# Patient Record
Sex: Female | Born: 1960 | Race: White | Hispanic: No | Smoking: Never smoker
Health system: Southern US, Community
[De-identification: ages and names within clinical notes are randomized; demographics above are authoritative.]

## PROBLEM LIST (undated history)

## (undated) DIAGNOSIS — F419 Anxiety disorder, unspecified: Secondary | ICD-10-CM

## (undated) HISTORY — PX: GANGLION CYST EXCISION: SHX1691

## (undated) HISTORY — PX: BREAST BIOPSY: SHX20

## (undated) HISTORY — DX: Anxiety disorder, unspecified: F41.9

---

## 1985-02-15 HISTORY — PX: BREAST EXCISIONAL BIOPSY: SUR124

## 1997-11-04 ENCOUNTER — Other Ambulatory Visit: Admission: RE | Admit: 1997-11-04 | Discharge: 1997-11-04 | Payer: Self-pay | Admitting: Obstetrics and Gynecology

## 1998-11-14 ENCOUNTER — Other Ambulatory Visit: Admission: RE | Admit: 1998-11-14 | Discharge: 1998-11-14 | Payer: Self-pay | Admitting: Gynecology

## 1999-10-16 ENCOUNTER — Other Ambulatory Visit: Admission: RE | Admit: 1999-10-16 | Discharge: 1999-10-16 | Payer: Self-pay | Admitting: Gynecology

## 2000-04-08 ENCOUNTER — Encounter: Payer: Self-pay | Admitting: Gynecology

## 2000-04-08 ENCOUNTER — Encounter: Admission: RE | Admit: 2000-04-08 | Discharge: 2000-04-08 | Payer: Self-pay | Admitting: Gynecology

## 2000-10-07 ENCOUNTER — Encounter: Admission: RE | Admit: 2000-10-07 | Discharge: 2000-10-07 | Payer: Self-pay | Admitting: Gynecology

## 2000-10-07 ENCOUNTER — Encounter: Payer: Self-pay | Admitting: Gynecology

## 2000-11-21 ENCOUNTER — Other Ambulatory Visit: Admission: RE | Admit: 2000-11-21 | Discharge: 2000-11-21 | Payer: Self-pay | Admitting: Gynecology

## 2001-10-12 ENCOUNTER — Encounter: Admission: RE | Admit: 2001-10-12 | Discharge: 2001-10-12 | Payer: Self-pay | Admitting: Gynecology

## 2001-10-12 ENCOUNTER — Encounter: Payer: Self-pay | Admitting: Gynecology

## 2001-12-08 ENCOUNTER — Other Ambulatory Visit: Admission: RE | Admit: 2001-12-08 | Discharge: 2001-12-08 | Payer: Self-pay | Admitting: Gynecology

## 2002-02-15 HISTORY — PX: BREAST EXCISIONAL BIOPSY: SUR124

## 2002-10-17 ENCOUNTER — Encounter: Payer: Self-pay | Admitting: Gynecology

## 2002-10-17 ENCOUNTER — Encounter: Admission: RE | Admit: 2002-10-17 | Discharge: 2002-10-17 | Payer: Self-pay | Admitting: Gynecology

## 2003-01-04 ENCOUNTER — Other Ambulatory Visit: Admission: RE | Admit: 2003-01-04 | Discharge: 2003-01-04 | Payer: Self-pay | Admitting: Gynecology

## 2003-02-13 ENCOUNTER — Ambulatory Visit (HOSPITAL_COMMUNITY): Admission: RE | Admit: 2003-02-13 | Discharge: 2003-02-13 | Payer: Self-pay | Admitting: General Surgery

## 2003-11-21 ENCOUNTER — Encounter: Admission: RE | Admit: 2003-11-21 | Discharge: 2003-11-21 | Payer: Self-pay | Admitting: Gynecology

## 2003-11-27 ENCOUNTER — Encounter: Admission: RE | Admit: 2003-11-27 | Discharge: 2003-11-27 | Payer: Self-pay | Admitting: Gynecology

## 2004-01-07 ENCOUNTER — Other Ambulatory Visit: Admission: RE | Admit: 2004-01-07 | Discharge: 2004-01-07 | Payer: Self-pay | Admitting: Gynecology

## 2004-12-03 ENCOUNTER — Encounter: Admission: RE | Admit: 2004-12-03 | Discharge: 2004-12-03 | Payer: Self-pay | Admitting: Gynecology

## 2005-01-12 ENCOUNTER — Other Ambulatory Visit: Admission: RE | Admit: 2005-01-12 | Discharge: 2005-01-12 | Payer: Self-pay | Admitting: Gynecology

## 2005-01-21 ENCOUNTER — Ambulatory Visit: Payer: Self-pay | Admitting: Family Medicine

## 2005-03-11 ENCOUNTER — Ambulatory Visit: Payer: Self-pay | Admitting: Family Medicine

## 2005-04-02 ENCOUNTER — Ambulatory Visit: Payer: Self-pay | Admitting: Internal Medicine

## 2005-04-08 ENCOUNTER — Ambulatory Visit (HOSPITAL_COMMUNITY): Admission: RE | Admit: 2005-04-08 | Discharge: 2005-04-08 | Payer: Self-pay | Admitting: Internal Medicine

## 2005-06-07 ENCOUNTER — Ambulatory Visit: Payer: Self-pay | Admitting: Internal Medicine

## 2005-07-06 ENCOUNTER — Ambulatory Visit (HOSPITAL_COMMUNITY): Admission: RE | Admit: 2005-07-06 | Discharge: 2005-07-06 | Payer: Self-pay | Admitting: Internal Medicine

## 2005-07-06 ENCOUNTER — Ambulatory Visit: Payer: Self-pay | Admitting: Internal Medicine

## 2005-08-11 ENCOUNTER — Ambulatory Visit: Payer: Self-pay | Admitting: Family Medicine

## 2005-12-07 ENCOUNTER — Ambulatory Visit: Payer: Self-pay | Admitting: Internal Medicine

## 2005-12-08 ENCOUNTER — Encounter: Admission: RE | Admit: 2005-12-08 | Discharge: 2005-12-08 | Payer: Self-pay | Admitting: Gynecology

## 2006-11-02 ENCOUNTER — Ambulatory Visit: Payer: Self-pay | Admitting: Family Medicine

## 2006-11-02 LAB — CONVERTED CEMR LAB
Potassium: 4.3 meq/L (ref 3.5–5.3)
Vitamin B-12: 327 pg/mL (ref 211–911)

## 2006-11-03 ENCOUNTER — Encounter: Payer: Self-pay | Admitting: Family Medicine

## 2006-11-03 LAB — CONVERTED CEMR LAB: Free T4: 1.11 ng/dL (ref 0.89–1.80)

## 2006-11-14 ENCOUNTER — Encounter (HOSPITAL_COMMUNITY): Admission: RE | Admit: 2006-11-14 | Discharge: 2006-11-15 | Payer: Self-pay | Admitting: Endocrinology

## 2006-12-14 ENCOUNTER — Encounter: Admission: RE | Admit: 2006-12-14 | Discharge: 2006-12-14 | Payer: Self-pay | Admitting: Obstetrics and Gynecology

## 2007-02-16 ENCOUNTER — Encounter: Payer: Self-pay | Admitting: Family Medicine

## 2007-03-08 ENCOUNTER — Ambulatory Visit: Payer: Self-pay | Admitting: Family Medicine

## 2007-08-25 DIAGNOSIS — K589 Irritable bowel syndrome without diarrhea: Secondary | ICD-10-CM

## 2007-08-25 DIAGNOSIS — F411 Generalized anxiety disorder: Secondary | ICD-10-CM | POA: Insufficient documentation

## 2007-08-25 DIAGNOSIS — J301 Allergic rhinitis due to pollen: Secondary | ICD-10-CM | POA: Insufficient documentation

## 2007-11-08 ENCOUNTER — Telehealth: Payer: Self-pay | Admitting: Family Medicine

## 2007-12-15 ENCOUNTER — Encounter: Admission: RE | Admit: 2007-12-15 | Discharge: 2007-12-15 | Payer: Self-pay | Admitting: Obstetrics and Gynecology

## 2008-12-19 ENCOUNTER — Encounter: Admission: RE | Admit: 2008-12-19 | Discharge: 2008-12-19 | Payer: Self-pay | Admitting: Obstetrics and Gynecology

## 2009-12-25 ENCOUNTER — Encounter: Admission: RE | Admit: 2009-12-25 | Discharge: 2009-12-25 | Payer: Self-pay | Admitting: Obstetrics and Gynecology

## 2010-03-09 ENCOUNTER — Encounter: Payer: Self-pay | Admitting: Endocrinology

## 2010-03-19 NOTE — Letter (Signed)
Summary: office visits  office visits   Imported By: Lind Guest 10/02/2009 07:58:46  _____________________________________________________________________  External Attachment:    Type:   Image     Comment:   External Document

## 2010-03-19 NOTE — Letter (Signed)
Summary: demo  demo   Imported By: Lind Guest 10/02/2009 07:56:34  _____________________________________________________________________  External Attachment:    Type:   Image     Comment:   External Document

## 2010-03-19 NOTE — Letter (Signed)
Summary: phone notes  phone notes   Imported By: Lind Guest 10/02/2009 07:59:18  _____________________________________________________________________  External Attachment:    Type:   Image     Comment:   External Document

## 2010-03-19 NOTE — Letter (Signed)
Summary: history & physical  history & physical   Imported By: Lind Guest 10/02/2009 07:57:07  _____________________________________________________________________  External Attachment:    Type:   Image     Comment:   External Document

## 2010-03-19 NOTE — Letter (Signed)
Summary: x rays  x rays   Imported By: Lind Guest 10/02/2009 07:59:43  _____________________________________________________________________  External Attachment:    Type:   Image     Comment:   External Document

## 2010-03-19 NOTE — Letter (Signed)
Summary: misc  misc   Imported By: Lind Guest 10/02/2009 07:58:03  _____________________________________________________________________  External Attachment:    Type:   Image     Comment:   External Document

## 2010-03-19 NOTE — Letter (Signed)
Summary: labs  labs   Imported By: Lind Guest 10/02/2009 07:57:38  _____________________________________________________________________  External Attachment:    Type:   Image     Comment:   External Document

## 2010-03-19 NOTE — Letter (Signed)
Summary: consults  consults   Imported By: Lind Guest 10/02/2009 07:56:09  _____________________________________________________________________  External Attachment:    Type:   Image     Comment:   External Document

## 2010-07-03 NOTE — Op Note (Signed)
NAMEJUSTUS, DUERR           ACCOUNT NO.:  [id number]   MEDICAL RECORD NO.:  [id number]          PATIENT TYPE:  AMB   LOCATION:  DAY                           FACILITY:  [facility]   PHYSICIAN:  Lionel December, M.D.    DATE OF BIRTH:  09/25/1960   DATE OF PROCEDURE:  07/06/2005  DATE OF DISCHARGE:                                 OPERATIVE REPORT   PROCEDURE:  Colonoscopy with terminal ileoscopy.   INDICATIONS:  Kayla [name] is 50 year old Caucasian female with chronic/recurrent  lower abdominal pain and constipation.  She had essentially negative  abdominal pelvic CT.  Two to three stools are heme-positive.  She is  therefore undergoing diagnostic colonoscopy.  Procedure and risks were  reviewed with the patient, and informed consent was obtained.   MEDICATIONS FOR CONSCIOUS SEDATION:  Demerol 25 mg IV, Versed 6 mg IV.   FINDINGS:  Procedure performed in endoscopy suite.  The patient's vital  signs and O2 saturation were monitored during the procedure and remained  stable.  The patient was placed in left lateral position and rectal  examination performed.  No abnormality noted on external or digital exam.  Olympus videoscope was placed in rectum and advanced under vision into  sigmoid colon and beyond.  She had excellent prep.  Scope was advanced to  cecum which was identified by appendiceal orifice and ileocecal valve.  Scope was passed into TI.  There appeared to be a single diverticulum which  could only be seen once.  Scope was passed about 15 cm and mucosa was  normal.  Pictures taken for the record.  As the scope was withdrawn, colonic  mucosa was examined for the second time and was normal throughout.  Rectal  mucosa similarly was normal.  Scope was retroflexed to examine anorectal  junction.  She had small hemorrhoids below the dentate line and thickened  mucosa at the anal canal.  Once again, pictures taken and endoscope was  straightened and withdrawn.  The patient tolerated the  procedure well.   FINAL DIAGNOSES:  1.  Normal colonoscopy except small external hemorrhoids.  2.  Normal terminal ileum, suspicion of one diverticulum.  This probably is      an incidental finding and has nothing to do with her symptoms.   RECOMMENDATIONS:  1.  Small-bowel follow-through to complete her workup.  2.  High-fiber diet.  3.  Benefiber 2 to 4 grams q.d.  4.  Colace 2 tablets bedtime.  5.  Please note her CBC from today is normal.  Her H&H is 13.5 and 39.5,      although she had 90% segs.      Lionel December, M.D.  Electronically Signed     NR/MEDQ  D:  07/06/2005  T:  07/06/2005  Job:  [id number]   cc:   Milus Mallick. Lodema Hong, M.D.  Fax: [phone]

## 2010-07-03 NOTE — Op Note (Signed)
Kayla [name], Kayla [name]                     ACCOUNT NO.:  [id number]   MEDICAL RECORD NO.:  [id number]                   PATIENT TYPE:  AMB   LOCATION:  DAY                                  FACILITY:  [facility]   PHYSICIAN:  Barbaraann Barthel, M.D.              DATE OF BIRTH:  [birth year]   DATE OF PROCEDURE:  02/13/2003  DATE OF DISCHARGE:                                 OPERATIVE REPORT   PROCEDURE:  Left partial mastectomy.   PREOPERATIVE DIAGNOSES:  Nondiagnostic and abnormal left mammogram.   POSTOPERATIVE DIAGNOSES:  Nondiagnostic and abnormal left mammogram.   SPECIMENS:  Left breast tissue.   Note, this is a 50 year old white female who had been followed for the  breast mass for approximately two years.  She has had multiple sonograms and  mammograms.  There was a question of adenofibroma noted; however, the  patient opted for biopsy to clarify this and as she had an abnormal  mammogram in a 50 year old female with a palpable lump this is certainly  reasonably for biopsy.   We discussed complications not limited to but including bleeding, infection  and the possibility that further surgery may be required.  Informed consent  was obtained.   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  Final  pathology is pending.   TECHNIQUE:  The patient was placed in the supine position, after the  adequate administration of LMA anesthesia her left hemithorax was prepped  with Betadine solution and draped in the usual manner.  A small transverse  incision was carried out over the palpable mass in approximately the 3  o'clock position of the left breast. The mass was removed and sent for  formalin for final pathology. Bleeding was controlled with the cautery  device. The breast tissue was approximated with 3-0 Polysorb and skin closed  cosmetically with a 5-0 Polysorb suture with Steri-Strips  used to further approximate the skin.  Neosporin and a sterile dressing  was  applied. Prior to closure, all sponge, needle and instrument counts were  found to be correct. Estimated blood loss was minimal.  No drains were  placed and there were no complications.      ___________________________________________                                            Barbaraann Barthel, M.D.   WB/MEDQ  D:  02/13/2003  T:  02/13/2003  Job:  [id number]   cc:   Ivor Costa. Farrel Gobble, M.D.  [address], [address]  [place]  [place] [address]  Fax: [phone]

## 2010-12-21 ENCOUNTER — Other Ambulatory Visit: Payer: Self-pay | Admitting: Obstetrics and Gynecology

## 2010-12-21 DIAGNOSIS — Z1231 Encounter for screening mammogram for malignant neoplasm of breast: Secondary | ICD-10-CM

## 2011-01-13 ENCOUNTER — Ambulatory Visit: Payer: Self-pay

## 2011-01-28 ENCOUNTER — Ambulatory Visit: Payer: Self-pay

## 2011-02-02 ENCOUNTER — Ambulatory Visit
Admission: RE | Admit: 2011-02-02 | Discharge: 2011-02-02 | Disposition: A | Discharge status: Home / Self Care | Payer: BC Managed Care – PPO | Source: Ambulatory Visit | Attending: Obstetrics and Gynecology | Admitting: Obstetrics and Gynecology

## 2011-02-02 DIAGNOSIS — Z1231 Encounter for screening mammogram for malignant neoplasm of breast: Secondary | ICD-10-CM

## 2012-01-07 ENCOUNTER — Other Ambulatory Visit: Payer: Self-pay | Admitting: Obstetrics and Gynecology

## 2012-01-07 DIAGNOSIS — Z1231 Encounter for screening mammogram for malignant neoplasm of breast: Secondary | ICD-10-CM

## 2012-02-17 ENCOUNTER — Ambulatory Visit
Admission: RE | Admit: 2012-02-17 | Discharge: 2012-02-17 | Disposition: A | Discharge status: Home / Self Care | Payer: BC Managed Care – PPO | Source: Ambulatory Visit | Attending: Obstetrics and Gynecology | Admitting: Obstetrics and Gynecology

## 2012-02-17 DIAGNOSIS — Z1231 Encounter for screening mammogram for malignant neoplasm of breast: Secondary | ICD-10-CM

## 2012-12-28 ENCOUNTER — Encounter (INDEPENDENT_AMBULATORY_CARE_PROVIDER_SITE_OTHER): Payer: Self-pay

## 2012-12-28 ENCOUNTER — Ambulatory Visit (INDEPENDENT_AMBULATORY_CARE_PROVIDER_SITE_OTHER): Payer: BC Managed Care – PPO | Admitting: Otolaryngology

## 2012-12-28 DIAGNOSIS — H6980 Other specified disorders of Eustachian tube, unspecified ear: Secondary | ICD-10-CM

## 2012-12-28 DIAGNOSIS — J310 Chronic rhinitis: Secondary | ICD-10-CM

## 2012-12-28 DIAGNOSIS — J342 Deviated nasal septum: Secondary | ICD-10-CM

## 2012-12-28 DIAGNOSIS — H903 Sensorineural hearing loss, bilateral: Secondary | ICD-10-CM

## 2013-01-25 ENCOUNTER — Ambulatory Visit (INDEPENDENT_AMBULATORY_CARE_PROVIDER_SITE_OTHER): Payer: BC Managed Care – PPO | Admitting: Otolaryngology

## 2013-01-25 DIAGNOSIS — J342 Deviated nasal septum: Secondary | ICD-10-CM

## 2013-01-25 DIAGNOSIS — J310 Chronic rhinitis: Secondary | ICD-10-CM

## 2013-01-25 DIAGNOSIS — K219 Gastro-esophageal reflux disease without esophagitis: Secondary | ICD-10-CM

## 2013-02-28 ENCOUNTER — Other Ambulatory Visit: Payer: Self-pay

## 2013-02-28 DIAGNOSIS — Z1231 Encounter for screening mammogram for malignant neoplasm of breast: Secondary | ICD-10-CM

## 2013-03-05 ENCOUNTER — Ambulatory Visit: Payer: BC Managed Care – PPO

## 2013-03-19 ENCOUNTER — Ambulatory Visit
Admission: RE | Admit: 2013-03-19 | Discharge: 2013-03-19 | Disposition: A | Discharge status: Home / Self Care | Payer: BC Managed Care – PPO | Source: Ambulatory Visit

## 2013-03-19 ENCOUNTER — Ambulatory Visit: Payer: BC Managed Care – PPO

## 2013-03-19 DIAGNOSIS — Z1231 Encounter for screening mammogram for malignant neoplasm of breast: Secondary | ICD-10-CM

## 2013-03-21 ENCOUNTER — Other Ambulatory Visit: Payer: Self-pay | Admitting: Obstetrics and Gynecology

## 2013-03-21 DIAGNOSIS — R928 Other abnormal and inconclusive findings on diagnostic imaging of breast: Secondary | ICD-10-CM

## 2013-04-04 ENCOUNTER — Ambulatory Visit
Admission: RE | Admit: 2013-04-04 | Discharge: 2013-04-04 | Disposition: A | Discharge status: Home / Self Care | Payer: BC Managed Care – PPO | Source: Ambulatory Visit | Attending: Obstetrics and Gynecology | Admitting: Obstetrics and Gynecology

## 2013-04-04 DIAGNOSIS — R928 Other abnormal and inconclusive findings on diagnostic imaging of breast: Secondary | ICD-10-CM

## 2013-09-05 ENCOUNTER — Other Ambulatory Visit: Payer: Self-pay | Admitting: Obstetrics and Gynecology

## 2013-09-05 DIAGNOSIS — R921 Mammographic calcification found on diagnostic imaging of breast: Secondary | ICD-10-CM

## 2013-10-02 ENCOUNTER — Ambulatory Visit
Admission: RE | Admit: 2013-10-02 | Discharge: 2013-10-02 | Disposition: A | Discharge status: Home / Self Care | Payer: BC Managed Care – PPO | Source: Ambulatory Visit | Attending: Obstetrics and Gynecology | Admitting: Obstetrics and Gynecology

## 2013-10-02 ENCOUNTER — Encounter (INDEPENDENT_AMBULATORY_CARE_PROVIDER_SITE_OTHER): Payer: Self-pay

## 2013-10-02 DIAGNOSIS — R921 Mammographic calcification found on diagnostic imaging of breast: Secondary | ICD-10-CM

## 2014-03-22 ENCOUNTER — Other Ambulatory Visit: Payer: Self-pay

## 2014-03-22 ENCOUNTER — Other Ambulatory Visit: Payer: Self-pay | Admitting: Obstetrics and Gynecology

## 2014-03-22 DIAGNOSIS — R921 Mammographic calcification found on diagnostic imaging of breast: Secondary | ICD-10-CM

## 2014-04-16 ENCOUNTER — Ambulatory Visit
Admission: RE | Admit: 2014-04-16 | Discharge: 2014-04-16 | Disposition: A | Discharge status: Home / Self Care | Payer: BLUE CROSS/BLUE SHIELD | Source: Ambulatory Visit | Attending: Obstetrics and Gynecology | Admitting: Obstetrics and Gynecology

## 2014-04-16 DIAGNOSIS — R921 Mammographic calcification found on diagnostic imaging of breast: Secondary | ICD-10-CM

## 2015-01-21 ENCOUNTER — Ambulatory Visit (INDEPENDENT_AMBULATORY_CARE_PROVIDER_SITE_OTHER): Payer: BLUE CROSS/BLUE SHIELD

## 2015-01-21 DIAGNOSIS — J309 Allergic rhinitis, unspecified: Secondary | ICD-10-CM | POA: Diagnosis not present

## 2015-01-21 NOTE — Progress Notes (Signed)
Kayla [name] came in today to pick-up vials and receive first injection.  She receives ITX through the [employer] where she works.  She receives 2 injections weekly, following schedule C, every 2 weeks once at 0.50.  1.  Green 1:1000 (Cat-Mold) 0.10 RUA.   2.  Green 1:1000 (Grass-Weed-Tree) 0.10 LUA She waited 30 minutes without reaction.  Epipen is current.  Vials and paperwork released to patient.

## 2015-04-30 ENCOUNTER — Other Ambulatory Visit: Payer: Self-pay

## 2015-04-30 DIAGNOSIS — Z1231 Encounter for screening mammogram for malignant neoplasm of breast: Secondary | ICD-10-CM

## 2015-04-30 DIAGNOSIS — Z0000 Encounter for general adult medical examination without abnormal findings: Secondary | ICD-10-CM

## 2015-05-13 ENCOUNTER — Ambulatory Visit
Admission: RE | Admit: 2015-05-13 | Discharge: 2015-05-13 | Disposition: A | Discharge status: Home / Self Care | Payer: BLUE CROSS/BLUE SHIELD | Source: Ambulatory Visit

## 2015-05-13 ENCOUNTER — Other Ambulatory Visit: Payer: Self-pay

## 2015-05-13 DIAGNOSIS — Z1231 Encounter for screening mammogram for malignant neoplasm of breast: Secondary | ICD-10-CM

## 2015-05-28 DIAGNOSIS — J3089 Other allergic rhinitis: Secondary | ICD-10-CM | POA: Diagnosis not present

## 2015-05-29 DIAGNOSIS — J301 Allergic rhinitis due to pollen: Secondary | ICD-10-CM | POA: Diagnosis not present

## 2015-06-24 ENCOUNTER — Ambulatory Visit (INDEPENDENT_AMBULATORY_CARE_PROVIDER_SITE_OTHER): Payer: BLUE CROSS/BLUE SHIELD

## 2015-06-24 DIAGNOSIS — J309 Allergic rhinitis, unspecified: Secondary | ICD-10-CM | POA: Diagnosis not present

## 2015-07-01 ENCOUNTER — Encounter (INDEPENDENT_AMBULATORY_CARE_PROVIDER_SITE_OTHER): Payer: Self-pay | Admitting: *Deleted

## 2015-07-15 ENCOUNTER — Encounter: Payer: Self-pay | Admitting: Allergy and Immunology

## 2015-07-15 ENCOUNTER — Ambulatory Visit (INDEPENDENT_AMBULATORY_CARE_PROVIDER_SITE_OTHER): Payer: BLUE CROSS/BLUE SHIELD | Admitting: Allergy and Immunology

## 2015-07-15 VITALS — BP 120/65 | HR 84 | Temp 98.4°F | Resp 18

## 2015-07-15 DIAGNOSIS — J309 Allergic rhinitis, unspecified: Secondary | ICD-10-CM

## 2015-07-15 DIAGNOSIS — H1010 Acute atopic conjunctivitis, unspecified eye: Secondary | ICD-10-CM | POA: Diagnosis not present

## 2015-07-15 MED ORDER — EPINEPHRINE 0.3 MG/0.3ML IJ SOAJ: 0.3000 mg | Freq: Once | INTRAMUSCULAR | Status: DC

## 2015-07-15 NOTE — Progress Notes (Signed)
     FOLLOW UP NOTE  RE: Kayla [name] MRN: [id number] DOB: 03/05/1960 [facility]. [address] [place], [place] [address] Date of Office Visit: 07/15/2015  Subjective:  Kayla [name] is a 55 y.o. female who presents today for Allergic Rhinitis  and Other  Assessment:   1. Allergic rhinoconjunctivitis, on immunotherapy.   2.      Intermittent postnasal drip and associated cough, currently asymptomatic. Plan:   Meds ordered this encounter  Medications  . EPINEPHrine (EPIPEN 2-PAK) 0.3 mg/0.3 mL IJ SOAJ injection    Sig: Inject 0.3 mLs (0.3 mg total) into the muscle once. Please fill with Mylan generic.    Dispense:  1 Device    Refill:  1  1.  Continue current regime, may give trial Xyzal 5 mg daily needed. 2.  Continue allergen immunotherapy. 3.  Emergency action plan in place. 4.  Consider discontinuing injections at and of current vials and review need for further management/possible retest. 5.  Follow-up in 9 months or sooner if needed.  HPI: Kayla [name] returns to office in follow-up of allergic rhinoconjunctivitis on immunotherapy.  Since her last visit in 2016 she has maintained on immunotherapy with green vial as her maintenance.  During this warmer time of year/pollen season she does note the Pollen vial seems to have slight erythema and raised area at injection site (we do not have her sheets available today--as she receives at work with the nurse).  Otherwise, she has done well, without large local or systemic reactions.  She uses Allegra on immunotherapy day but typically has required no consistent oral medications for nasal sprays.  Over the years she has noted a great improvement without recurring upper respiratory  symptoms, sinus difficulties or any need for prednisone or antibiotics.  She may add Benadryl for itchy nose, postnasal drip associated congestion, and after extended time outdoors.  She has not tried a 24-hour antihistamine at  that time.  Overall, she is very pleased.  Denies ED or urgent care visits, prednisone or antibiotic courses. Reports sleep and activity are normal.  Jasleene has a current medication list which includes the following prescription(s): diphenhydramine, epinephrine, and fexofenadine.   Drug Allergies: Allergies  Allergen Reactions  . Sulfonamide Derivatives    Objective:   Filed Vitals:   07/15/15 1054  BP: 120/65  Pulse: 84  Temp: 98.4 F (36.9 C)  Resp: 18   SpO2 Readings from Last 1 Encounters:  07/15/15 98%   Physical Exam  Constitutional: She is well-developed, well-nourished, and in no distress.  HENT:  Head: Atraumatic.  Right Ear: Tympanic membrane and ear canal normal.  Left Ear: Tympanic membrane and ear canal normal.  Nose: Mucosal edema present. No rhinorrhea. No epistaxis.  Mouth/Throat: Oropharynx is clear and moist and mucous membranes are normal. No oropharyngeal exudate, posterior oropharyngeal edema or posterior oropharyngeal erythema.  Neck: Neck supple.  Cardiovascular: Normal rate, S1 normal and S2 normal.   No murmur heard. Pulmonary/Chest: Effort normal. She has no wheezes. She has no rhonchi. She has no rales.  Lymphadenopathy:    She has no cervical adenopathy.     Kayla M. Ishmael Holter, MD  cc: Kayla Nakayama, MD

## 2015-07-15 NOTE — Patient Instructions (Signed)
Continue current regime.  Continue allergen immunotherapy.  Emergency action plan in place.  Consider discontinuing injections at and of current vials and review need for further management.  Follow-up in 9 months or sooner if needed.

## 2015-08-07 ENCOUNTER — Other Ambulatory Visit: Payer: Self-pay

## 2015-08-07 ENCOUNTER — Telehealth: Payer: Self-pay | Admitting: Allergy and Immunology

## 2015-08-07 ENCOUNTER — Other Ambulatory Visit (HOSPITAL_COMMUNITY): Payer: Self-pay | Admitting: Internal Medicine

## 2015-08-07 DIAGNOSIS — J301 Allergic rhinitis due to pollen: Secondary | ICD-10-CM | POA: Diagnosis not present

## 2015-08-07 DIAGNOSIS — Z13820 Encounter for screening for osteoporosis: Secondary | ICD-10-CM

## 2015-08-07 MED ORDER — EPINEPHRINE 0.3 MG/0.3ML IJ SOAJ: 0.3000 mg | Freq: Once | INTRAMUSCULAR | Status: DC

## 2015-08-07 NOTE — Telephone Encounter (Signed)
Pt called and said that we were suppose to sent the name brand and the generic for the epi-pen, but they only sent in the generic one and she wants the brand names sent in.   [phone] [id number].

## 2015-08-07 NOTE — Telephone Encounter (Signed)
SENT IN BRAND OF EPIPEN, AND INFORMED HER

## 2015-08-08 DIAGNOSIS — J3089 Other allergic rhinitis: Secondary | ICD-10-CM | POA: Diagnosis not present

## 2015-08-12 ENCOUNTER — Ambulatory Visit (INDEPENDENT_AMBULATORY_CARE_PROVIDER_SITE_OTHER): Payer: BLUE CROSS/BLUE SHIELD

## 2015-08-12 DIAGNOSIS — J309 Allergic rhinitis, unspecified: Secondary | ICD-10-CM

## 2015-08-13 ENCOUNTER — Ambulatory Visit (HOSPITAL_COMMUNITY)
Admission: RE | Admit: 2015-08-13 | Discharge: 2015-08-13 | Disposition: A | Discharge status: Home / Self Care | Payer: BLUE CROSS/BLUE SHIELD | Source: Ambulatory Visit | Attending: Internal Medicine | Admitting: Internal Medicine

## 2015-08-13 ENCOUNTER — Other Ambulatory Visit (HOSPITAL_COMMUNITY): Payer: BLUE CROSS/BLUE SHIELD

## 2015-08-13 DIAGNOSIS — Z13820 Encounter for screening for osteoporosis: Secondary | ICD-10-CM | POA: Insufficient documentation

## 2015-08-13 DIAGNOSIS — M85851 Other specified disorders of bone density and structure, right thigh: Secondary | ICD-10-CM | POA: Diagnosis not present

## 2015-08-14 DIAGNOSIS — J3089 Other allergic rhinitis: Secondary | ICD-10-CM | POA: Diagnosis not present

## 2015-08-15 DIAGNOSIS — J3089 Other allergic rhinitis: Secondary | ICD-10-CM | POA: Diagnosis not present

## 2015-09-18 ENCOUNTER — Telehealth: Payer: Self-pay | Admitting: Allergy and Immunology

## 2015-09-18 NOTE — Telephone Encounter (Signed)
Paid MM bal in June - will send pmts on Epic bal - sent copy of statement

## 2015-11-18 ENCOUNTER — Ambulatory Visit (INDEPENDENT_AMBULATORY_CARE_PROVIDER_SITE_OTHER): Payer: BLUE CROSS/BLUE SHIELD | Admitting: *Deleted

## 2015-11-18 DIAGNOSIS — J309 Allergic rhinitis, unspecified: Secondary | ICD-10-CM

## 2015-11-18 NOTE — Progress Notes (Signed)
Immunotherapy   Patient Details  Name: Kayla [name] MRN: [id number] Date of Birth: [birth year]  11/18/2015  [name]: Pick up vials to continue getting allergy injections at her place of employment. First injection of new vials given in office. Patient stays on [name] as her maintenance. Grass-Weed-Tree  0.10 cc given Cat-Mold  0.10 cc given  Following schedule: C  Frequency: Every 2 weeks once at .50 Epi-Pen:Patient does have Epipen Patient instructions given and treatment record for [facility] to record injections given to patient. Patient waited 20 minutes after injection and no reaction noted.          [name] 11/18/2015, 9:35 PM

## 2016-02-12 ENCOUNTER — Telehealth: Payer: Self-pay | Admitting: Allergy

## 2016-02-12 NOTE — Telephone Encounter (Signed)
Pt received 2 different bills / statements that she had a question about and they don't show details.Please call back. Thanks

## 2016-02-19 NOTE — Telephone Encounter (Signed)
Left message to call me back.

## 2016-02-20 NOTE — Telephone Encounter (Signed)
Sent copy of statement per pt's frequest

## 2016-03-18 DIAGNOSIS — J3089 Other allergic rhinitis: Secondary | ICD-10-CM

## 2016-03-19 DIAGNOSIS — J301 Allergic rhinitis due to pollen: Secondary | ICD-10-CM | POA: Diagnosis not present

## 2016-03-22 NOTE — Addendum Note (Signed)
Addended by: Felipa Emory on: 03/22/2016 04:22 PM   Modules accepted: Orders

## 2016-03-23 ENCOUNTER — Ambulatory Visit (INDEPENDENT_AMBULATORY_CARE_PROVIDER_SITE_OTHER): Payer: BLUE CROSS/BLUE SHIELD | Admitting: *Deleted

## 2016-03-23 DIAGNOSIS — J309 Allergic rhinitis, unspecified: Secondary | ICD-10-CM | POA: Diagnosis not present

## 2016-03-25 NOTE — Progress Notes (Signed)
Kayla [name] came in to the office on 03/23/16 to pick-up vials and receive first injection of new Green Vials.  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 She receives ITX through [employer] where she works.  She receives 2 injections weekly following Schedule C, every 3 weeks once at 0.50. 1. Green 1:1000 (Grass-Weed-Tree) 0.10 given  RA 2. Cat-Mold 1:1000(Cat-Mold) 0.10 given LA She waited 30 minutes after injection and no reaction noted. Epipen is current.  Vials and paperwork released to the patient.

## 2016-05-07 ENCOUNTER — Other Ambulatory Visit: Payer: Self-pay | Admitting: Obstetrics and Gynecology

## 2016-05-07 DIAGNOSIS — Z1231 Encounter for screening mammogram for malignant neoplasm of breast: Secondary | ICD-10-CM

## 2016-06-01 ENCOUNTER — Ambulatory Visit
Admission: RE | Admit: 2016-06-01 | Discharge: 2016-06-01 | Disposition: A | Discharge status: Home / Self Care | Payer: BLUE CROSS/BLUE SHIELD | Source: Ambulatory Visit | Attending: Obstetrics and Gynecology | Admitting: Obstetrics and Gynecology

## 2016-06-01 DIAGNOSIS — Z1231 Encounter for screening mammogram for malignant neoplasm of breast: Secondary | ICD-10-CM

## 2016-06-10 ENCOUNTER — Other Ambulatory Visit (INDEPENDENT_AMBULATORY_CARE_PROVIDER_SITE_OTHER): Payer: Self-pay | Admitting: *Deleted

## 2016-06-10 DIAGNOSIS — Z1211 Encounter for screening for malignant neoplasm of colon: Secondary | ICD-10-CM

## 2016-08-10 ENCOUNTER — Encounter: Payer: Self-pay | Admitting: Allergy & Immunology

## 2016-08-10 ENCOUNTER — Ambulatory Visit (INDEPENDENT_AMBULATORY_CARE_PROVIDER_SITE_OTHER): Payer: BLUE CROSS/BLUE SHIELD | Admitting: Allergy & Immunology

## 2016-08-10 ENCOUNTER — Ambulatory Visit: Payer: BLUE CROSS/BLUE SHIELD

## 2016-08-10 VITALS — BP 120/75 | HR 70 | Temp 98.0°F | Resp 16

## 2016-08-10 DIAGNOSIS — J3089 Other allergic rhinitis: Secondary | ICD-10-CM | POA: Diagnosis not present

## 2016-08-10 DIAGNOSIS — J302 Other seasonal allergic rhinitis: Secondary | ICD-10-CM

## 2016-08-10 MED ORDER — EPINEPHRINE 0.3 MG/0.3ML IJ SOAJ: 0.3000 mg | Freq: Once | INTRAMUSCULAR | 1 refills | Status: DC

## 2016-08-10 MED ORDER — ALBUTEROL SULFATE HFA 108 (90 BASE) MCG/ACT IN AERS: 2.0000 | INHALATION_SPRAY | RESPIRATORY_TRACT | 1 refills | Status: DC | PRN

## 2016-08-10 NOTE — Progress Notes (Signed)
Dilute to green in RV.  jm

## 2016-08-10 NOTE — Progress Notes (Signed)
FOLLOW UP  Date of Service/Encounter:  08/10/16   Assessment:   Seasonal and perennial allergic rhinitis - on an extended course of allergen immunotherapy  Shortness of breath - intermittent  Plan/Recommendations:   1. Seasonal and perennial allergic rhinitis - We will continue shots at the same schedule.  - We did discuss that allergy shots are typically given for 3-5 years in total, however Kayla [name] feels that she would like to continue since she is feeling so good on them.  - It should be noted that she has been taken off of shots previously and had worsening of her symptoms.  - We reviewed your schedule and we should be able to move to every four weeks after this next vial is completed. - Continue with antihistamines as needed.   2. Shortness of breath - We will send in ProAir to use as needed. - There is no indication for a controller at this time since these episodes are occurring so rarely.   3. Return in about 1 year (around 08/10/2017).   Subjective:   Kayla [name] is a 56 y.o. female presenting today for follow up of  Chief Complaint  Patient presents with  . Allergic Rhinitis     Kayla [name] has a history of the following: Patient Active Problem List   Diagnosis Date Noted  . Special screening for malignant neoplasms, colon 06/10/2016  . ANXIETY 08/25/2007  . ALLERGIC RHINITIS, SEASONAL 08/25/2007  . IBS 08/25/2007    History obtained from: chart review and patient.  Kayla [name] was referred by Asencion Noble, MD.     Kayla [name] is a 56 y.o. female presenting for a follow up visit. She has a history of allergic rhinoconjunctivitis and is on allergy shots. She was last seen in our clinic in an office visit in May 2017. At that time, she was doing well. We have maintained her on her green vial as her maintenance. At the last visit, she was using Allegra on her allergy shot days but otherwise was not using any medication  routinely.  Since the last visit, she has done well. She continues to receive 2 bottles: #1 contains grass/week/trees and # 2 contains cat/mold. She received her injections at the [employer] where she works. She is on Schedule C and receives 0.22mL max every three weeks. She is unsure how long she has been on shots in total. She has had problems since she was a child. She did try getting off shots at a previous practice but she did not do well.   She works for [employer] and receives her injections in the wellness clinic there, under the direction of a Designer, jewellery. She does endorse some wheezing that occurs every 3-4 months. She has never been on albuterol. Overall, these have become less frequent since her allergy shots have been ongoing.  Otherwise, there have been no changes to her past medical history, surgical history, family history, or social history.    Review of Systems: a 14-point review of systems is pertinent for what is mentioned in HPI.  Otherwise, all other systems were negative. Constitutional: negative other than that listed in the HPI Eyes: negative other than that listed in the HPI Ears, nose, mouth, throat, and face: negative other than that listed in the HPI Respiratory: negative other than that listed in the HPI Cardiovascular: negative other than that listed in the HPI Gastrointestinal: negative other than that listed in the HPI  Genitourinary: negative other than that listed in the HPI Integument: negative other than that listed in the HPI Hematologic: negative other than that listed in the HPI Musculoskeletal: negative other than that listed in the HPI Neurological: negative other than that listed in the HPI Allergy/Immunologic: negative other than that listed in the HPI    Objective:   Blood pressure 120/75, pulse 70, temperature 98 F (36.7 C), resp. rate 16, last menstrual period 02/11/2012, SpO2 96 %. There is  no height or weight on file to calculate BMI.   Physical Exam:  General: Alert, interactive, in no acute distress. Pleasant and talkative.  Eyes: No conjunctival injection present on the right, No conjunctival injection present on the left, Conjunctival injection on the right with limbal sparing, Conjunctival injection on the left with limbal sparing, PERRL bilaterally, No discharge on the right, No discharge on the left and No Horner-Trantas dots present Ears: Right TM pearly gray with normal light reflex, Left TM pearly gray with normal light reflex, Right TM intact without perforation and Left TM intact without perforation.  Nose/Throat: External nose within normal limits and septum midline, turbinates edematous without discharge, post-pharynx mildly erythematous without cobblestoning in the posterior oropharynx. Tonsils 2+ without exudates Neck: Supple without thyromegaly. Lungs: Clear to auscultation without wheezing, rhonchi or rales. No increased work of breathing. CV: Normal S1/S2, no murmurs. Capillary refill <2 seconds.  Skin: Warm and dry, without lesions or rashes. Neuro:   Grossly intact. No focal deficits appreciated. Responsive to questions.   Diagnostic studies: none     Salvatore Marvel, MD [facility]

## 2016-08-10 NOTE — Patient Instructions (Addendum)
1. Seasonal and perennial allergic rhinitis - We will continue shots at the same schedule.  - We reviewed your schedule and we should be able to move to every four weeks after this next vial is completed. - Continue with antihistamines as needed.   2. Return in about 1 year (around 08/10/2017).   Please inform us of any Emergency Department visits, hospitalizations, or changes in symptoms. Call us before going to the ED for breathing or allergy symptoms since we might be able to fit you in for a sick visit. Feel free to contact us anytime with any questions, problems, or concerns.  It was a pleasure to meet you today! Happy summer!   Websites that have reliable patient information: 1. American Academy of Asthma, Allergy, and Immunology: www.aaaai.org 2. Food Allergy Research and Education (FARE): foodallergy.org 3. Mothers of Asthmatics: http://www.asthmacommunitynetwork.org 4. American College of Allergy, Asthma, and Immunology: www.acaai.org

## 2016-08-11 DIAGNOSIS — J3089 Other allergic rhinitis: Secondary | ICD-10-CM | POA: Diagnosis not present

## 2016-08-17 ENCOUNTER — Telehealth: Payer: Self-pay

## 2016-08-17 NOTE — Telephone Encounter (Signed)
Called patient. Left message for patient informing her that [facility] has been trying to contact her about her Auvi-q. I asked her to please contact them at [phone]. If she had anymore questions she could call the office.

## 2016-08-19 NOTE — Telephone Encounter (Signed)
Patient is picking up vials on Tuesday in [employer]. Follow up with patient then.

## 2016-09-07 ENCOUNTER — Ambulatory Visit (INDEPENDENT_AMBULATORY_CARE_PROVIDER_SITE_OTHER): Payer: Commercial Managed Care - PPO

## 2016-09-07 DIAGNOSIS — J309 Allergic rhinitis, unspecified: Secondary | ICD-10-CM

## 2016-09-17 ENCOUNTER — Telehealth (INDEPENDENT_AMBULATORY_CARE_PROVIDER_SITE_OTHER): Payer: Self-pay | Admitting: *Deleted

## 2016-09-17 ENCOUNTER — Encounter (INDEPENDENT_AMBULATORY_CARE_PROVIDER_SITE_OTHER): Payer: Self-pay | Admitting: *Deleted

## 2016-09-17 NOTE — Telephone Encounter (Signed)
Patient needs trilyte 

## 2016-09-20 MED ORDER — PEG 3350-KCL-NA BICARB-NACL 420 G PO SOLR: 4000.0000 mL | Freq: Once | ORAL | 0 refills | Status: AC

## 2016-10-11 ENCOUNTER — Telehealth (INDEPENDENT_AMBULATORY_CARE_PROVIDER_SITE_OTHER): Payer: Self-pay | Admitting: *Deleted

## 2016-10-11 NOTE — Telephone Encounter (Signed)
agree

## 2016-10-11 NOTE — Telephone Encounter (Signed)
Referring MD/PCP: fagan   Procedure: tcs  Reason/Indication:  screening  Has patient had this procedure before?  Yes, 11 yrs ago  If so, when, by whom and where?    Is there a family history of colon cancer?  no  Who?  What age when diagnosed?    Is patient diabetic?   no      Does patient have prosthetic heart valve or mechanical valve?  no  Do you have a pacemaker?  no  Has patient ever had endocarditis? no  Has patient had joint replacement within last 12 months?  no  Does patient tend to be constipated or take laxatives? no  Does patient have a history of alcohol/drug use?  no  Is patient on Coumadin, Plavix and/or Aspirin? no  Medications: biast hormone replacement 5 mg per mil (70/30) daily, compounded progesterone cream 4% daily, progesterone 150 mg nightly, MVI daily, probiotics  Allergies: sulfur drugs  Medication Adjustment per Dr Laural Golden:   Procedure date & time: 11/10/16 at 830

## 2016-11-10 ENCOUNTER — Ambulatory Visit (HOSPITAL_COMMUNITY)
Admission: RE | Admit: 2016-11-10 | Discharge: 2016-11-10 | Disposition: A | Discharge status: Home / Self Care | Payer: Commercial Managed Care - PPO | Source: Ambulatory Visit | Attending: Internal Medicine | Admitting: Internal Medicine

## 2016-11-10 ENCOUNTER — Encounter (HOSPITAL_COMMUNITY): Payer: Self-pay

## 2016-11-10 ENCOUNTER — Telehealth: Payer: Self-pay | Admitting: *Deleted

## 2016-11-10 ENCOUNTER — Encounter (HOSPITAL_COMMUNITY)
Admission: RE | Disposition: A | Discharge status: Home / Self Care | Payer: Self-pay | Source: Ambulatory Visit | Attending: Internal Medicine

## 2016-11-10 DIAGNOSIS — Z79899 Other long term (current) drug therapy: Secondary | ICD-10-CM | POA: Insufficient documentation

## 2016-11-10 DIAGNOSIS — Z79890 Hormone replacement therapy (postmenopausal): Secondary | ICD-10-CM | POA: Insufficient documentation

## 2016-11-10 DIAGNOSIS — K644 Residual hemorrhoidal skin tags: Secondary | ICD-10-CM | POA: Diagnosis not present

## 2016-11-10 DIAGNOSIS — D122 Benign neoplasm of ascending colon: Secondary | ICD-10-CM

## 2016-11-10 DIAGNOSIS — Z1211 Encounter for screening for malignant neoplasm of colon: Secondary | ICD-10-CM | POA: Insufficient documentation

## 2016-11-10 DIAGNOSIS — K6289 Other specified diseases of anus and rectum: Secondary | ICD-10-CM | POA: Diagnosis not present

## 2016-11-10 DIAGNOSIS — Z8371 Family history of colonic polyps: Secondary | ICD-10-CM | POA: Insufficient documentation

## 2016-11-10 HISTORY — PX: COLONOSCOPY: SHX5424

## 2016-11-10 HISTORY — PX: POLYPECTOMY: SHX5525

## 2016-11-10 SURGERY — COLONOSCOPY
Anesthesia: Moderate Sedation

## 2016-11-10 MED ORDER — MIDAZOLAM HCL 5 MG/5ML IJ SOLN
INTRAMUSCULAR | Status: AC
  Filled 2016-11-10: qty 10

## 2016-11-10 MED ORDER — STERILE WATER FOR IRRIGATION IR SOLN
Status: DC | PRN
  Administered 2016-11-10: 09:00:00

## 2016-11-10 MED ORDER — MEPERIDINE HCL 50 MG/ML IJ SOLN
INTRAMUSCULAR | Status: DC | PRN
  Administered 2016-11-10 (×2): 25 mg via INTRAVENOUS

## 2016-11-10 MED ORDER — MEPERIDINE HCL 50 MG/ML IJ SOLN
INTRAMUSCULAR | Status: AC
  Filled 2016-11-10: qty 1

## 2016-11-10 MED ORDER — SODIUM CHLORIDE 0.9 % IV SOLN
INTRAVENOUS | Status: DC
  Administered 2016-11-10: 08:00:00 via INTRAVENOUS

## 2016-11-10 MED ORDER — MIDAZOLAM HCL 5 MG/5ML IJ SOLN
INTRAMUSCULAR | Status: DC | PRN
  Administered 2016-11-10 (×2): 1 mg via INTRAVENOUS
  Administered 2016-11-10 (×3): 2 mg via INTRAVENOUS

## 2016-11-10 NOTE — H&P (Signed)
Kayla [name] is an 56 y.o. female.   Chief Complaint: patient is here for colonoscopy. HPI: patient's 56 year old Caucasian female was screening colonoscopy.Last exam was normal 11 years ago. She denies in bowel habits or rectal bleeding. While family history is negative for CRC, both her parents ha colonic polyps.  History reviewed. No pertinent past medical history.  Past Surgical History:  Procedure Laterality Date  . BREAST BIOPSY Left   . BREAST EXCISIONAL BIOPSY Left 2004   benign  . BREAST EXCISIONAL BIOPSY Left 1987   benign  . GANGLION CYST EXCISION Left     Family History  Problem Relation Age of Onset  . Allergic rhinitis Father   . Allergic rhinitis Sister   . Breast cancer Maternal Aunt   . Angioedema Neg Hx   . Asthma Neg Hx   . Atopy Neg Hx   . Eczema Neg Hx   . Urticaria Neg Hx   . Immunodeficiency Neg Hx    Social History:  reports that she has never smoked. She has never used smokeless tobacco. She reports that she drinks alcohol. She reports that she does not use drugs.  Allergies:  Allergies  Allergen Reactions  . Sulfonamide Derivatives   . Sulfa Antibiotics Rash    Medications Prior to Admission  Medication Sig Dispense Refill  . EPINEPHrine (EPIPEN 2-PAK) 0.3 mg/0.3 mL IJ SOAJ injection Inject 0.3 mLs (0.3 mg total) into the muscle once. Please fill with Mylan generic. 1 Device 1  . Estradiol-Estriol-Progesterone (BIEST/PROGESTERONE) CREA Apply 1 application topically daily.    . fexofenadine (ALLEGRA) 180 MG tablet Take 180 mg by mouth daily as needed.     Marland Kitchen FINACEA 15 % FOAM Apply 1 application topically daily as needed. to face  12  . PRESCRIPTION MEDICATION Compounded progesterone cream - apply once daily topically    . Progesterone Micronized (PROGESTERONE PO) Take 150 mg by mouth every evening.      No results found for this or any previous visit (from the past 48 hour(s)). No results found.  ROS  Blood pressure 118/70, pulse  86, temperature (!) 97.5 F (36.4 C), temperature source Oral, resp. rate 18, height 5\' 6"  (1.676 m), weight 123 lb (55.8 kg), last menstrual period 02/11/2012, SpO2 100 %. Physical Exam  Constitutional:  Well-developed thin Caucasian female in NAD.  HENT:  Mouth/Throat: Oropharynx is clear and moist.  Eyes: Conjunctivae are normal. No scleral icterus.  Neck: No thyromegaly present.  Cardiovascular: Normal rate, regular rhythm and normal heart sounds.   No murmur heard. Respiratory: Effort normal.  GI: Soft. She exhibits no distension and no mass. There is no tenderness.  Musculoskeletal: She exhibits no edema.  Lymphadenopathy:    She has no cervical adenopathy.  Neurological: She is alert.  Skin: Skin is warm and dry.     Assessment/Plan Average risk screening colonoscopy.  Hildred Laser, MD 11/10/2016, 8:32 AM

## 2016-11-10 NOTE — Op Note (Signed)
[facility] Patient Name: Kayla [name] Procedure Date: 11/10/2016 8:06 AM MRN: [id number] Date of Birth: 1961-01-04 Attending MD: Hildred Laser , MD CSN: [id number] Age: 56 Admit Type: Outpatient Procedure:                Colonoscopy Indications:              Screening for colorectal malignant neoplasm Providers:                Hildred Laser, MD, Otis Peak B. Sharon Seller, RN, Janeece Riggers, RN Referring MD:             Asencion Noble, MD Medicines:                Meperidine 50 mg IV, Midazolam 8 mg IV Complications:            No immediate complications. Estimated Blood Loss:     Estimated blood loss was minimal. Procedure:                Pre-Anesthesia Assessment:                           - Prior to the procedure, a History and Physical                            was performed, and patient medications and                            allergies were reviewed. The patient's tolerance of                            previous anesthesia was also reviewed. The risks                            and benefits of the procedure and the sedation                            options and risks were discussed with the patient.                            All questions were answered, and informed consent                            was obtained. Prior Anticoagulants: The patient has                            taken no previous anticoagulant or antiplatelet                            agents. ASA Grade Assessment: I - A normal, healthy                            patient. After reviewing the risks and benefits,  the patient was deemed in satisfactory condition to                            undergo the procedure.                           After obtaining informed consent, the colonoscope                            was passed under direct vision. Throughout the                            procedure, the patient's blood pressure, pulse, and       oxygen saturations were monitored continuously. The                            EC-[id number] ([id number]) scope was introduced through                            the anus and advanced to the the cecum, identified                            by appendiceal orifice and ileocecal valve. The                            colonoscopy was performed without difficulty. The                            patient tolerated the procedure well. The quality                            of the bowel preparation was excellent. The                            ileocecal valve, appendiceal orifice, and rectum                            were photographed. Scope In: 8:42:19 AM Scope Out: 8:59:06 AM Scope Withdrawal Time: 0 hours 6 minutes 52 seconds  Total Procedure Duration: 0 hours 16 minutes 47 seconds  Findings:      The perianal and digital rectal examinations were normal.      A 5 mm polyp was found in the proximal ascending colon. The polyp was       sessile. The polyp was removed with a cold snare. Resection and       retrieval were complete.      The exam was otherwise normal throughout the examined colon.      External hemorrhoids were found during retroflexion. The hemorrhoids       were small.      Anal papilla(e) were hypertrophied. Impression:               - One 5 mm polyp in the proximal ascending colon,                            removed  with a cold snare. Resected and retrieved.                           - External hemorrhoids.                           - Anal papilla(e) were hypertrophied. Moderate Sedation:      Moderate (conscious) sedation was administered by the endoscopy nurse       and supervised by the endoscopist. The following parameters were       monitored: oxygen saturation, heart rate, blood pressure, CO2       capnography and response to care. Total physician intraservice time was       23 minutes. Recommendation:           - Patient has a contact number available for                             emergencies. The signs and symptoms of potential                            delayed complications were discussed with the                            patient. Return to normal activities tomorrow.                            Written discharge instructions were provided to the                            patient.                           - Resume previous diet today.                           - Continue present medications.                           - Await pathology results.                           - Repeat colonoscopy date to be determined after                            pending pathology results are reviewed. Procedure Code(s):        --- Professional ---                           [phone], Colonoscopy, flexible; with removal of                            tumor(s), polyp(s), or other lesion(s) by snare                            technique  97026, Moderate sedation services provided by the                            same physician or other qualified health care                            professional performing the diagnostic or                            therapeutic service that the sedation supports,                            requiring the presence of an independent trained                            observer to assist in the monitoring of the                            patient's level of consciousness and physiological                            status; initial 15 minutes of intraservice time,                            patient age 106 years or older                           [phone], Moderate sedation services; each additional                            15 minutes intraservice time Diagnosis Code(s):        --- Professional ---                           Z12.11, Encounter for screening for malignant                            neoplasm of colon                           D12.2, Benign neoplasm of ascending colon                           K62.89, Other  specified diseases of anus and rectum                           K64.4, Residual hemorrhoidal skin tags CPT copyright 2016 American Medical Association. All rights reserved. The codes documented in this report are preliminary and upon coder review may  be revised to meet current compliance requirements. Hildred Laser, MD Hildred Laser, MD 11/10/2016 9:06:19 AM This report has been signed electronically. Number of Addenda: 0

## 2016-11-10 NOTE — Telephone Encounter (Signed)
Returning pts call from 11/09/16.  Pt called [place] office and spoke to [place] regarding the dates of her last vials, OV and injection.  Pt requested to speak with me regarding her dates of service and insurance.  Called patient and left message to return my call.

## 2016-11-10 NOTE — Discharge Instructions (Addendum)
High-Fiber Diet Fiber, also called dietary fiber, is a type of carbohydrate found in fruits, vegetables, whole grains, and beans. A high-fiber diet can have many health benefits. Your health care provider may recommend a high-fiber diet to help:  Prevent constipation. Fiber can make your bowel movements more regular.  Lower your cholesterol.  Relieve hemorrhoids, uncomplicated diverticulosis, or irritable bowel syndrome.  Prevent overeating as part of a weight-loss plan.  Prevent heart disease, type 2 diabetes, and certain cancers.  What is my plan? The recommended daily intake of fiber includes:  38 grams for men under age 67.  31 grams for men over age 71.  61 grams for women under age 60.  32 grams for women over age 13.  You can get the recommended daily intake of dietary fiber by eating a variety of fruits, vegetables, grains, and beans. Your health care provider may also recommend a fiber supplement if it is not possible to get enough fiber through your diet. What do I need to know about a high-fiber diet?  Fiber supplements have not been widely studied for their effectiveness, so it is better to get fiber through food sources.  Always check the fiber content on thenutrition facts label of any prepackaged food. Look for foods that contain at least 5 grams of fiber per serving.  Ask your dietitian if you have questions about specific foods that are related to your condition, especially if those foods are not listed in the following section.  Increase your daily fiber consumption gradually. Increasing your intake of dietary fiber too quickly may cause bloating, cramping, or gas.  Drink plenty of water. Water helps you to digest fiber. What foods can I eat? Grains Whole-grain breads. Multigrain cereal. Oats and oatmeal. Brown rice. Barley. Bulgur wheat. [place]. Bran muffins. Popcorn. Rye wafer crackers. Vegetables Sweet potatoes. Spinach. Kale. Artichokes. Cabbage.  Broccoli. Green peas. Carrots. Squash. Fruits Berries. Pears. Apples. Oranges. Avocados. Prunes and raisins. Dried figs. Meats and Other Protein Sources Navy, kidney, pinto, and soy beans. Split peas. Lentils. Nuts and seeds. Dairy Fiber-fortified yogurt. Beverages Fiber-fortified soy milk. Fiber-fortified orange juice. Other Fiber bars. The items listed above may not be a complete list of recommended foods or beverages. Contact your dietitian for more options. What foods are not recommended? Grains White bread. Pasta made with refined flour. White rice. Vegetables Fried potatoes. Canned vegetables. Well-cooked vegetables. Fruits Fruit juice. Cooked, strained fruit. Meats and Other Protein Sources Fatty cuts of meat. Fried Sales executive or fried fish. Dairy Milk. Yogurt. Cream cheese. Sour cream. Beverages Soft drinks. Other Cakes and pastries. Butter and oils. The items listed above may not be a complete list of foods and beverages to avoid. Contact your dietitian for more information. What are some tips for including high-fiber foods in my diet?  Eat a wide variety of high-fiber foods.  Make sure that half of all grains consumed each day are whole grains.  Replace breads and cereals made from refined flour or white flour with whole-grain breads and cereals.  Replace white rice with brown rice, bulgur wheat, or millet.  Start the day with a breakfast that is high in fiber, such as a cereal that contains at least 5 grams of fiber per serving.  Use beans in place of meat in soups, salads, or pasta.  Eat high-fiber snacks, such as berries, raw vegetables, nuts, or popcorn. This information is not intended to replace advice given to you by your health care provider. Make sure you discuss any  questions you have with your health care provider. Document Released: 02/01/2005 Document Revised: 07/10/2015 Document Reviewed: 07/17/2013 Elsevier Interactive Patient Education  2017  [place]. Colonoscopy, Adult, Care After This sheet gives you information about how to care for yourself after your procedure. Your health care provider may also give you more specific instructions. If you have problems or questions, contact your health care provider. What can I expect after the procedure? After the procedure, it is common to have:  A small amount of blood in your stool for 24 hours after the procedure.  Some gas.  Mild abdominal cramping or bloating.  Follow these instructions at home: General instructions   For the first 24 hours after the procedure: ? Do not drive or use machinery. ? Do not sign important documents. ? Do not drink alcohol. ? Do your regular daily activities at a slower pace than normal. ? Eat soft, easy-to-digest foods. ? Rest often.  Take over-the-counter or prescription medicines only as told by your health care provider.  It is up to you to get the results of your procedure. Ask your health care provider, or the department performing the procedure, when your results will be ready. Relieving cramping and bloating  Try walking around when you have cramps or feel bloated.  Apply heat to your abdomen as told by your health care provider. Use a heat source that your health care provider recommends, such as a moist heat pack or a heating pad. ? Place a towel between your skin and the heat source. ? Leave the heat on for 20-30 minutes. ? Remove the heat if your skin turns bright red. This is especially important if you are unable to feel pain, heat, or cold. You may have a greater risk of getting burned. Eating and drinking  Drink enough fluid to keep your urine clear or pale yellow.  Resume your normal diet as instructed by your health care provider. Avoid heavy or fried foods that are hard to digest.  Avoid drinking alcohol for as long as instructed by your health care provider. Contact a health care provider if:  You have blood in  your stool 2-3 days after the procedure. Get help right away if:  You have more than a small spotting of blood in your stool.  You pass large blood clots in your stool.  Your abdomen is swollen.  You have nausea or vomiting.  You have a fever.  You have increasing abdominal pain that is not relieved with medicine. This information is not intended to replace advice given to you by your health care provider. Make sure you discuss any questions you have with your health care provider. Document Released: 09/16/2003 Document Revised: 10/27/2015 Document Reviewed: 04/15/2015 Elsevier Interactive Patient Education  2018 [place] usual medications and diet as before. No driving for 24 hours. Physician will call with biopsy results and further recommendations.

## 2016-11-12 ENCOUNTER — Encounter (HOSPITAL_COMMUNITY): Payer: Self-pay | Admitting: Internal Medicine

## 2017-02-23 DIAGNOSIS — J3089 Other allergic rhinitis: Secondary | ICD-10-CM | POA: Diagnosis not present

## 2017-02-23 NOTE — Progress Notes (Signed)
DILUTE TO GREEN VIALS

## 2017-02-25 ENCOUNTER — Encounter: Payer: Self-pay | Admitting: *Deleted

## 2017-02-25 DIAGNOSIS — J3081 Allergic rhinitis due to animal (cat) (dog) hair and dander: Secondary | ICD-10-CM | POA: Diagnosis not present

## 2017-03-15 ENCOUNTER — Ambulatory Visit (INDEPENDENT_AMBULATORY_CARE_PROVIDER_SITE_OTHER): Payer: Commercial Managed Care - PPO | Admitting: *Deleted

## 2017-03-15 DIAGNOSIS — J309 Allergic rhinitis, unspecified: Secondary | ICD-10-CM

## 2017-03-15 NOTE — Progress Notes (Signed)
Kayla [name] [facility]) came in to office on 03/15/17 to pick up new Green Vials and receive first injection in office from new vials.  [name] is maintenance for patient.  Schedule C and every 4 weeks once at 0.50. She receives ITX through [place] where she works.  Patient did receive 0.10 cc of each vial in office today. Cat-Mold and Grass-Weed-Tree.  Patient waited 30 minutes after injection and no issues.  EpiPen is current.  Vials and paperwork released to patient.

## 2017-05-23 ENCOUNTER — Other Ambulatory Visit: Payer: Self-pay | Admitting: Obstetrics and Gynecology

## 2017-05-23 DIAGNOSIS — Z1231 Encounter for screening mammogram for malignant neoplasm of breast: Secondary | ICD-10-CM

## 2017-06-17 ENCOUNTER — Ambulatory Visit
Admission: RE | Admit: 2017-06-17 | Discharge: 2017-06-17 | Disposition: A | Discharge status: Home / Self Care | Payer: Commercial Managed Care - PPO | Source: Ambulatory Visit | Attending: Obstetrics and Gynecology | Admitting: Obstetrics and Gynecology

## 2017-06-17 DIAGNOSIS — Z1231 Encounter for screening mammogram for malignant neoplasm of breast: Secondary | ICD-10-CM

## 2017-07-19 ENCOUNTER — Ambulatory Visit (INDEPENDENT_AMBULATORY_CARE_PROVIDER_SITE_OTHER): Payer: Commercial Managed Care - PPO | Admitting: Allergy & Immunology

## 2017-07-19 ENCOUNTER — Encounter: Payer: Self-pay | Admitting: Allergy & Immunology

## 2017-07-19 VITALS — BP 112/68 | HR 81 | Resp 17 | Ht 65.75 in | Wt 120.0 lb

## 2017-07-19 DIAGNOSIS — J302 Other seasonal allergic rhinitis: Secondary | ICD-10-CM | POA: Diagnosis not present

## 2017-07-19 DIAGNOSIS — J3089 Other allergic rhinitis: Secondary | ICD-10-CM

## 2017-07-19 NOTE — Patient Instructions (Addendum)
1. Seasonal and perennial allergic rhinitis - We will continue shots but space out to every six weeks.  - Continue with antihistamines as needed.   2. Return in about 1 year (around 07/20/2018).   Please inform us of any Emergency Department visits, hospitalizations, or changes in symptoms. Call us before going to the ED for breathing or allergy symptoms since we might be able to fit you in for a sick visit. Feel free to contact us anytime with any questions, problems, or concerns.  It was a pleasure to meet you today! Happy summer!   Websites that have reliable patient information: 1. American Academy of Asthma, Allergy, and Immunology: www.aaaai.org 2. Food Allergy Research and Education (FARE): foodallergy.org 3. Mothers of Asthmatics: http://www.asthmacommunitynetwork.org 4. American College of Allergy, Asthma, and Immunology: www.acaai.org

## 2017-07-19 NOTE — Progress Notes (Signed)
FOLLOW UP  Date of Service/Encounter:  07/19/17   Assessment:   Seasonal and perennial allergic rhinitis - on an extended course of allergen immunotherapy  Shortness of breath - intermittent  Plan/Recommendations:   1. Seasonal and perennial allergic rhinitis - We will continue shots but space out to every six weeks.  - It seems that she is resistant to this, so we will defer to her to make a decision.  - Continue with antihistamines as needed.   2. Return in about 1 year (around 07/20/2018).   Subjective:   Kayla [name] is a 57 y.o. female presenting today for follow up of  Chief Complaint  Patient presents with  . Allergic Rhinitis     Kayla [name] has a history of the following: Patient Active Problem List   Diagnosis Date Noted  . Special screening for malignant neoplasms, colon 06/10/2016  . ANXIETY 08/25/2007  . ALLERGIC RHINITIS, SEASONAL 08/25/2007  . IBS 08/25/2007    History obtained from: chart review and patient.  Kayla [name] Primary Care Provider is Asencion Noble, MD.     Kayla [name] is a 57 y.o. female presenting for a follow up visit.  She was last seen in June 2018.  At that time, we continued allergy shots at the same schedule.  We did try to stop her injections but she insisted on keeping them on board.  For her shortness of breath, we sent him pro-air as needed.  These episodes were occurring so infrequently that we did not start a controller medication.  Since the last visit, she has mostly done well. She is getting her injections every four weeks and doing well with this. Her maintenance is Green. She taking Allegra on the days of injections. She is using the nasal saline rinses every morning.  She is willing to stop her allergy shots, so she says at times. However, at other times it seems that she does not want to change anything. We have a very circular conversation about this during her visit today, which is similar to previous  visits with her.   Otherwise, there have been no changes to her past medical history, surgical history, family history, or social history.    Review of Systems: a 14-point review of systems is pertinent for what is mentioned in HPI.  Otherwise, all other systems were negative. Constitutional: negative other than that listed in the HPI Eyes: negative other than that listed in the HPI Ears, nose, mouth, throat, and face: negative other than that listed in the HPI Respiratory: negative other than that listed in the HPI Cardiovascular: negative other than that listed in the HPI Gastrointestinal: negative other than that listed in the HPI Genitourinary: negative other than that listed in the HPI Integument: negative other than that listed in the HPI Hematologic: negative other than that listed in the HPI Musculoskeletal: negative other than that listed in the HPI Neurological: negative other than that listed in the HPI Allergy/Immunologic: negative other than that listed in the HPI    Objective:   Blood pressure 112/68, pulse 81, resp. rate 17, height 5' 5.75" (1.67 m), weight 120 lb (54.4 kg), last menstrual period 02/11/2012, SpO2 98 %. Body mass index is 19.52 kg/m.   Physical Exam:  General: Alert, interactive, in no acute distress. Pleasant female. Talkative.  Eyes: No conjunctival injection bilaterally, no discharge on the right, no discharge on the left and no Horner-Trantas dots present. PERRL bilaterally. EOMI without pain. No photophobia.  Ears: Right TM pearly gray with normal light reflex, Left TM pearly gray with normal light reflex, Right TM intact without perforation and Left TM intact without perforation.  Nose/Throat: External nose within normal limits and septum midline. Turbinates edematous and pale with clear discharge. Posterior oropharynx erythematous without cobblestoning in the posterior oropharynx. Tonsils 2+ without exudates.  Tongue without  thrush. Lungs: Clear to auscultation without wheezing, rhonchi or rales. No increased work of breathing. CV: Normal S1/S2. No murmurs. Capillary refill <2 seconds.  Skin: Warm and dry, without lesions or rashes. Neuro:   Grossly intact. No focal deficits appreciated. Responsive to questions.  Diagnostic studies: none     Kayla Marvel, MD  [facility] [place] [facility] [place]

## 2017-08-01 ENCOUNTER — Other Ambulatory Visit (HOSPITAL_COMMUNITY): Payer: Self-pay | Admitting: Internal Medicine

## 2017-08-01 DIAGNOSIS — Z780 Asymptomatic menopausal state: Secondary | ICD-10-CM

## 2017-08-01 NOTE — Progress Notes (Signed)
Dilute to green.

## 2017-08-02 DIAGNOSIS — J301 Allergic rhinitis due to pollen: Secondary | ICD-10-CM | POA: Diagnosis not present

## 2017-08-03 DIAGNOSIS — J3089 Other allergic rhinitis: Secondary | ICD-10-CM | POA: Diagnosis not present

## 2017-08-29 ENCOUNTER — Other Ambulatory Visit (HOSPITAL_COMMUNITY): Payer: Commercial Managed Care - PPO

## 2017-09-01 ENCOUNTER — Other Ambulatory Visit (HOSPITAL_COMMUNITY): Payer: Commercial Managed Care - PPO

## 2017-09-08 ENCOUNTER — Other Ambulatory Visit (HOSPITAL_COMMUNITY): Payer: Commercial Managed Care - PPO

## 2017-09-15 ENCOUNTER — Ambulatory Visit (HOSPITAL_COMMUNITY)
Admission: RE | Admit: 2017-09-15 | Discharge: 2017-09-15 | Disposition: A | Discharge status: Home / Self Care | Payer: Commercial Managed Care - PPO | Source: Ambulatory Visit | Attending: Internal Medicine | Admitting: Internal Medicine

## 2017-09-15 DIAGNOSIS — Z13820 Encounter for screening for osteoporosis: Secondary | ICD-10-CM | POA: Insufficient documentation

## 2017-09-15 DIAGNOSIS — Z780 Asymptomatic menopausal state: Secondary | ICD-10-CM

## 2017-09-15 DIAGNOSIS — M85851 Other specified disorders of bone density and structure, right thigh: Secondary | ICD-10-CM | POA: Diagnosis not present

## 2017-10-11 ENCOUNTER — Ambulatory Visit (INDEPENDENT_AMBULATORY_CARE_PROVIDER_SITE_OTHER): Payer: Commercial Managed Care - PPO | Admitting: *Deleted

## 2017-10-11 DIAGNOSIS — J309 Allergic rhinitis, unspecified: Secondary | ICD-10-CM | POA: Diagnosis not present

## 2017-10-12 ENCOUNTER — Telehealth: Payer: Self-pay

## 2017-10-12 NOTE — Telephone Encounter (Signed)
Called patient and left message to call back.  Need fax number to send injection papers back to patient or if patient wants to pick back up in [place] on Tuesday.  Also need to know if patient wants Epi-Pen or Auvi-Q sent in.  Patient was going to check with her insurance and let me know.

## 2017-10-12 NOTE — Telephone Encounter (Signed)
Late entry patient came in yesterday 10/11/2017 to receive her injections and take her vials out. After taking with beth and leaving the office patient called back to inform me that her vials would expire in 02/24/2018. Patient stated that if she would come every 6 weeks she would only get 3 injections. I informed patient that she is to get injections weekly for the next 4 weeks then she would be every 6 weeks. Patient stated that even doing that she would still have medication left in her vial and they are too expensive to have medication expire and thought that vials didn't expire for a year. she was wondering if she could get them every 4 weeks instead of every 6 weeks and if so how could she get new paper work stating every 4 weeks instead of every 6 weeks. I informed patient that I would talk with Beth and Dr. Ernst Bowler and someone would give her a call back in regards to this matter. After talking with Beth and Dr. Ernst Bowler it is fine that she get them every 4 weeks and Eustaquio Maize is going to do her paper work.

## 2017-10-12 NOTE — Progress Notes (Signed)
Immunotherapy   Patient Details  Name: Kayla [name] MRN: [id number] Date of Birth: [birth year]  10/11/2017   [name] came in to the office to pick up new Green Vials and receive first injection in office from new vials.  [name] is maintenance for patient.  Schedule C every 4 weeks once at 0.50.  She receives her injections through [place] where she works.  Patient did receive 0.10 ml of each vial in office.  Cat-Mold and Grass-Weed-Tree.  Patient waited 30 minutes after injection and no issues.  Vials and paperwork released to patient.  Patient will check with her insurance and call back to let us know if she would like Epi-Pen or Auvi-Q sent in.  [name] 10/12/2017, 3:05 PM

## 2017-10-13 NOTE — Telephone Encounter (Signed)
Called and left message for patient to call office.

## 2017-10-13 NOTE — Telephone Encounter (Signed)
Patient returned call and requested updated injection forms be faxed to her at her work at [phone].  Forms faxed per request.  Patient voiced understanding of weekly build up and every 4 weeks once at 0.50.  Patient will return for allergy testing as she gets closer to the end of vials.  Patient will call back to let me know if she wants Auvi-Q or Epi-Pen sent to pharmacy after she checks with her insurance.

## 2017-11-08 ENCOUNTER — Telehealth: Payer: Self-pay

## 2017-11-08 NOTE — Telephone Encounter (Signed)
Patient is calling and would like a shot nurse to call her about her shot schedule.

## 2017-11-08 NOTE — Telephone Encounter (Signed)
Called patient and left message to return call to office.

## 2017-11-09 NOTE — Telephone Encounter (Signed)
Called and left message for patient to call office.

## 2017-11-10 NOTE — Telephone Encounter (Signed)
Spoke with patient and discussed her injection schedule and current vials expire January 2020. Patient is getting injections every 4 weeks and can receive her last injection at 3 week interval if needed to get in around holiday schedule and before her vials expire.  Patient will contact office for allergy testing before new vials being made.  Patient voiced understanding.

## 2017-12-12 ENCOUNTER — Encounter: Payer: Self-pay | Admitting: Internal Medicine

## 2017-12-12 DIAGNOSIS — N951 Menopausal and female climacteric states: Secondary | ICD-10-CM | POA: Insufficient documentation

## 2017-12-13 ENCOUNTER — Encounter: Payer: Self-pay | Admitting: Internal Medicine

## 2017-12-13 ENCOUNTER — Ambulatory Visit (INDEPENDENT_AMBULATORY_CARE_PROVIDER_SITE_OTHER): Payer: Commercial Managed Care - PPO | Admitting: Internal Medicine

## 2017-12-13 VITALS — BP 110/72 | HR 81 | Temp 98.6°F | Ht 62.25 in | Wt 124.8 lb

## 2017-12-13 DIAGNOSIS — R7989 Other specified abnormal findings of blood chemistry: Secondary | ICD-10-CM | POA: Diagnosis not present

## 2017-12-13 DIAGNOSIS — M8580 Other specified disorders of bone density and structure, unspecified site: Secondary | ICD-10-CM | POA: Diagnosis not present

## 2017-12-13 DIAGNOSIS — J301 Allergic rhinitis due to pollen: Secondary | ICD-10-CM

## 2017-12-13 DIAGNOSIS — N951 Menopausal and female climacteric states: Secondary | ICD-10-CM

## 2017-12-13 NOTE — Progress Notes (Signed)
  Subjective:     Patient ID: Kayla [name] , female    DOB: 1960/05/06 , 57 y.o.   MRN: [id number]   Chief Complaint  Patient presents with  . hormones f/u    HPI  SHE IS HERE TODAY FOR F/U BHRT. SHE FEELS WELL ON HER CURRENT REGIMEN. SHE REPORTS COMPLIANCE. SHE IS NOT HAVING ANY ISSUES WITH HER CURRENT REGIMEN. SHE IS USING BI-EST CREAM, PROGESTERONE CREAM NIGHTLY AND PROGESTERONE CAPSULES NIGHTLY.  SHE HAS FEW HOT FLASHES.     Past Medical History:  Diagnosis Date  . Anxiety       Current Outpatient Medications:  .  EPINEPHrine (EPIPEN 2-PAK) 0.3 mg/0.3 mL IJ SOAJ injection, Inject 0.3 mLs (0.3 mg total) into the muscle once. Please fill with Mylan generic., Disp: 1 Device, Rfl: 1 .  Estradiol-Estriol-Progesterone (BIEST/PROGESTERONE) CREA, Apply 1 application topically daily., Disp: , Rfl:  .  fexofenadine (ALLEGRA) 180 MG tablet, Take 180 mg by mouth daily as needed. , Disp: , Rfl:  .  metroNIDAZOLE (METROGEL EX), Apply topically., Disp: , Rfl:  .  Progesterone Micronized (PROGESTERONE PO), Take 150 mg by mouth every evening., Disp: , Rfl:    Allergies  Allergen Reactions  . Sulfonamide Derivatives   . Sulfa Antibiotics Rash     Review of Systems  Constitutional: Negative.   Eyes: Negative.   Respiratory: Negative.   Cardiovascular: Negative.   Gastrointestinal: Negative.   Musculoskeletal: Negative.   Neurological: Negative.   Psychiatric/Behavioral: Negative.      Today's Vitals   12/13/17 1513 12/13/17 1559  BP: 116/84 110/72  Pulse: 81   Temp: 98.6 F (37 C)   TempSrc: Oral   Weight: 124 lb 12.8 oz (56.6 kg)   Height: 5' 2.25" (1.581 m)    Body mass index is 22.64 kg/m.   Objective:  Physical Exam  Constitutional: She is oriented to person, place, and time. She appears well-developed and well-nourished.  HENT:  Head: Normocephalic and atraumatic.  Eyes: EOM are normal.  Cardiovascular: Normal rate, regular rhythm and normal heart sounds.   Pulmonary/Chest: Effort normal and breath sounds normal.  Neurological: She is alert and oriented to person, place, and time.  Skin: Skin is warm and dry.  Psychiatric: She has a normal mood and affect.  Nursing note and vitals reviewed.       Assessment And Plan:     1. Female climacteric state  I WILL DECREASE BIEST TO 3 CLICKS DAILY- I PLAN TO DECREASE HER BIEST BY ONE CLICK EVERY 30 DAYS.. SHE WILL CONTINUE WITH PROG SR 150 NIGHTLY EXCEPT SUNDAYS. SHE WILL CONTINUE WITH 2 CLICKS PROG CREAM NIGHTLY EXCEPT SUNDAYS.  I WILL CONSIDER  INCREASING PROG.CAPS TO 175, I WILL READDRESS IN 60 DAYS.  SHE WILL RTO IN FOUR MONTHS FOR RE-EVALUATION.   2. Abnormal thyroid blood test  SHE WILL GET LABWORK PERFORMED AT HER JOB. SHE WAS GIVEN A REQUISITION SLIP TO TAKE TO WORK. I WILL MAKE FURTHER RECOMMENDATIONS ONCE HER LABS ARE AVAILABLE FOR REVIEW.   3. Seasonal allergic rhinitis due to pollen  SHE WILL CONTINUE WITH IMMUNOTHERAPY AS PER HER ALLERGIST.  4. Osteopenia, unspecified location  RECENT DEXA SCAN RESULTS WERE REVIEWED IN FULL DETAIL. SHE IS ENCOURAGED TO CONTINUE WITH CALCIUM/VIT D SUPPLEMENTATION ALONG WITH REGULAR WEIGHT-BEARING EXERCISE.   Maximino Greenland, MD

## 2017-12-18 ENCOUNTER — Encounter: Payer: Self-pay | Admitting: Internal Medicine

## 2018-03-01 ENCOUNTER — Telehealth: Payer: Self-pay | Admitting: Internal Medicine

## 2018-03-01 ENCOUNTER — Encounter: Payer: Self-pay | Admitting: Internal Medicine

## 2018-03-01 NOTE — Telephone Encounter (Signed)
PT LVM THAT MYCHART CODE EXPIRED.ATT TO CONTACT PT TO RESET NO ANS UNABLE TO LVM DUE TO MAILBOX FULL. RESENT TEXT MSG LINK FOR [facility]

## 2018-03-15 ENCOUNTER — Telehealth: Payer: Self-pay

## 2018-03-15 NOTE — Telephone Encounter (Signed)
Received patients injection record. Called and left message for patient to call office to set up appt to pick up new vials.

## 2018-03-16 NOTE — Telephone Encounter (Signed)
Patient is holding off on reordering vials. She may be getting retested soon.

## 2018-04-04 ENCOUNTER — Encounter: Payer: Self-pay | Admitting: Internal Medicine

## 2018-04-11 ENCOUNTER — Ambulatory Visit: Payer: Commercial Managed Care - PPO | Admitting: Internal Medicine

## 2018-04-17 ENCOUNTER — Encounter: Payer: Self-pay | Admitting: Internal Medicine

## 2018-05-15 ENCOUNTER — Ambulatory Visit: Payer: Commercial Managed Care - PPO | Admitting: Internal Medicine

## 2018-05-22 ENCOUNTER — Encounter: Payer: Self-pay | Admitting: Internal Medicine

## 2018-05-31 ENCOUNTER — Encounter: Payer: Self-pay | Admitting: Internal Medicine

## 2018-06-01 ENCOUNTER — Ambulatory Visit (INDEPENDENT_AMBULATORY_CARE_PROVIDER_SITE_OTHER): Payer: Commercial Managed Care - PPO | Admitting: Internal Medicine

## 2018-06-01 ENCOUNTER — Other Ambulatory Visit: Payer: Self-pay | Admitting: Obstetrics and Gynecology

## 2018-06-01 ENCOUNTER — Encounter: Payer: Self-pay | Admitting: Internal Medicine

## 2018-06-01 ENCOUNTER — Other Ambulatory Visit: Payer: Self-pay

## 2018-06-01 VITALS — BP 110/62 | Temp 98.6°F | Wt 125.0 lb

## 2018-06-01 DIAGNOSIS — F419 Anxiety disorder, unspecified: Secondary | ICD-10-CM

## 2018-06-01 DIAGNOSIS — N951 Menopausal and female climacteric states: Secondary | ICD-10-CM | POA: Diagnosis not present

## 2018-06-01 DIAGNOSIS — J301 Allergic rhinitis due to pollen: Secondary | ICD-10-CM | POA: Diagnosis not present

## 2018-06-01 DIAGNOSIS — R7989 Other specified abnormal findings of blood chemistry: Secondary | ICD-10-CM

## 2018-06-01 DIAGNOSIS — Z1231 Encounter for screening mammogram for malignant neoplasm of breast: Secondary | ICD-10-CM

## 2018-06-01 NOTE — Patient Instructions (Signed)
Menopause  Menopause is the normal time of life when menstrual periods stop completely. It is usually confirmed by 12 months without a menstrual period. The transition to menopause (perimenopause) most often happens between the ages of 45 and 55. During perimenopause, hormone levels change in your body, which can cause symptoms and affect your health. Menopause may increase your risk for:   Loss of bone (osteoporosis), which causes bone breaks (fractures).   Depression.   Hardening and narrowing of the arteries (atherosclerosis), which can cause heart attacks and strokes.  What are the causes?  This condition is usually caused by a natural change in hormone levels that happens as you get older. The condition may also be caused by surgery to remove both ovaries (bilateral oophorectomy).  What increases the risk?  This condition is more likely to start at an earlier age if you have certain medical conditions or treatments, including:   A tumor of the pituitary gland in the brain.   A disease that affects the ovaries and hormone production.   Radiation treatment for cancer.   Certain cancer treatments, such as chemotherapy or hormone (anti-estrogen) therapy.   Heavy smoking and excessive alcohol use.   Family history of early menopause.  This condition is also more likely to develop earlier in women who are very thin.  What are the signs or symptoms?  Symptoms of this condition include:   Hot flashes.   Irregular menstrual periods.   Night sweats.   Changes in feelings about sex. This could be a decrease in sex drive or an increased comfort around your sexuality.   Vaginal dryness and thinning of the vaginal walls. This may cause painful intercourse.   Dryness of the skin and development of wrinkles.   Headaches.   Problems sleeping (insomnia).   Mood swings or irritability.   Memory problems.   Weight gain.   Hair growth on the face and chest.   Bladder infections or problems with urinating.  How  is this diagnosed?  This condition is diagnosed based on your medical history, a physical exam, your age, your menstrual history, and your symptoms. Hormone tests may also be done.  How is this treated?  In some cases, no treatment is needed. You and your health care provider should make a decision together about whether treatment is necessary. Treatment will be based on your individual condition and preferences. Treatment for this condition focuses on managing symptoms. Treatment may include:   Menopausal hormone therapy (MHT).   Medicines to treat specific symptoms or complications.   Acupuncture.   Vitamin or herbal supplements.  Before starting treatment, make sure to let your health care provider know if you have a personal or family history of:   Heart disease.   Breast cancer.   Blood clots.   Diabetes.   Osteoporosis.  Follow these instructions at home:  Lifestyle   Do not use any products that contain nicotine or tobacco, such as cigarettes and e-cigarettes. If you need help quitting, ask your health care provider.   Get at least 30 minutes of physical activity on 5 or more days each week.   Avoid alcoholic and caffeinated beverages, as well as spicy foods. This may help prevent hot flashes.   Get 7-8 hours of sleep each night.   If you have hot flashes, try:  ? Dressing in layers.  ? Avoiding things that may trigger hot flashes, such as spicy food, warm places, or stress.  ? Taking slow, deep   breaths when a hot flash starts.  ? Keeping a fan in your home and office.   Find ways to manage stress, such as deep breathing, meditation, or journaling.   Consider going to group therapy with other women who are having menopause symptoms. Ask your health care provider about recommended group therapy meetings.  Eating and drinking   Eat a healthy, balanced diet that contains whole grains, lean protein, low-fat dairy, and plenty of fruits and vegetables.   Your health care provider may recommend  adding more soy to your diet. Foods that contain soy include tofu, tempeh, and soy milk.   Eat plenty of foods that contain calcium and vitamin D for bone health. Items that are rich in calcium include low-fat milk, yogurt, beans, almonds, sardines, broccoli, and kale.  Medicines   Take over-the-counter and prescription medicines only as told by your health care provider.   Talk with your health care provider before starting any herbal supplements. If prescribed, take vitamins and supplements as told by your health care provider. These may include:  ? Calcium. Women age 51 and older should get 1,200 mg (milligrams) of calcium every day.  ? Vitamin D. Women need 600-800 International Units of vitamin D each day.  ? Vitamins B12 and B6. Aim for 50 micrograms of B12 and 1.5 mg of B6 each day.  General instructions   Keep track of your menstrual periods, including:  ? When they occur.  ? How heavy they are and how long they last.  ? How much time passes between periods.   Keep track of your symptoms, noting when they start, how often you have them, and how long they last.   Use vaginal lubricants or moisturizers to help with vaginal dryness and improve comfort during sex.   Keep all follow-up visits as told by your health care provider. This is important. This includes any group therapy or counseling.  Contact a health care provider if:   You are still having menstrual periods after age 55.   You have pain during sex.   You have not had a period for 12 months and you develop vaginal bleeding.  Get help right away if:   You have:  ? Severe depression.  ? Excessive vaginal bleeding.  ? Pain when you urinate.  ? A fast or irregular heart beat (palpitations).  ? Severe headaches.  ? Abdomen (abdominal) pain or severe indigestion.   You fell and you think you have a broken bone.   You develop leg or chest pain.   You develop vision problems.   You feel a lump in your breast.  Summary   Menopause is the normal  time of life when menstrual periods stop completely. It is usually confirmed by 12 months without a menstrual period.   The transition to menopause (perimenopause) most often happens between the ages of 45 and 55.   Symptoms can be managed through medicines, lifestyle changes, and complementary therapies such as acupuncture.   Eat a balanced diet that is rich in nutrients to promote bone health and heart health and to manage symptoms during menopause.  This information is not intended to replace advice given to you by your health care provider. Make sure you discuss any questions you have with your health care provider.  Document Released: 04/24/2003 Document Revised: 03/06/2016 Document Reviewed: 03/06/2016  Elsevier Interactive Patient Education  2019 Elsevier Inc.

## 2018-06-11 ENCOUNTER — Encounter: Payer: Self-pay | Admitting: Internal Medicine

## 2018-06-11 NOTE — Progress Notes (Signed)
Virtual Visit via Video   This visit type was conducted due to national recommendations for restrictions regarding the COVID-19 Pandemic (e.g. social distancing) in an effort to limit this patient's exposure and mitigate transmission in our community.  Due to her co-morbid illnesses, this patient is at least at moderate risk for complications without adequate follow up.  This format is felt to be most appropriate for this patient at this time.  All issues noted in this document were discussed and addressed.  A limited physical exam was performed with this format.    This visit type was conducted due to national recommendations for restrictions regarding the COVID-19 Pandemic (e.g. social distancing) in an effort to limit this patient's exposure and mitigate transmission in our community.  Patients identity confirmed using two different identifiers.  This format is felt to be most appropriate for this patient at this time.  All issues noted in this document were discussed and addressed.  No physical exam was performed (except for noted visual exam findings with Video Visits).    Date:  06/11/2018   ID:  Kayla [name], DOB 1961/02/03, MRN [id number]  Patient Location:   Work/Office  Provider location:   Office    Chief Complaint:  F/U BHRT  History of Present Illness:    Kayla [name] is a 58 y.o. female who presents via video conferencing for a telehealth visit today.    The patient does not have symptoms concerning for COVID-19 infection (fever, chills, cough, or new shortness of breath).   HPI   Past Medical History:  Diagnosis Date  . Anxiety    Past Surgical History:  Procedure Laterality Date  . BREAST BIOPSY Left   . BREAST EXCISIONAL BIOPSY Left 2004   benign  . BREAST EXCISIONAL BIOPSY Left 1987   benign  . COLONOSCOPY N/A 11/10/2016   Procedure: COLONOSCOPY;  Surgeon: Rogene Houston, MD;  Location: [facility];  Service: Endoscopy;  Laterality: N/A;   830  . GANGLION CYST EXCISION Left   . POLYPECTOMY  11/10/2016   Procedure: POLYPECTOMY;  Surgeon: Rogene Houston, MD;  Location: [facility];  Service: Endoscopy;;  colon     Current Meds  Medication Sig  . fexofenadine (ALLEGRA) 180 MG tablet Take 180 mg by mouth daily as needed.   . Progesterone Micronized (PROGESTERONE PO) Take 150 mg by mouth every evening.  Marland Kitchen PROGESTERONE, VAGINAL, 4 % GEL Place vaginally.  . [DISCONTINUED] Estradiol-Estriol-Progesterone (BIEST/PROGESTERONE) CREA Apply 1 application topically daily.     Allergies:   Sulfonamide derivatives and Sulfa antibiotics   Social History   Tobacco Use  . Smoking status: Never Smoker  . Smokeless tobacco: Never Used  Substance Use Topics  . Alcohol use: Yes    Alcohol/week: 0.0 standard drinks  . Drug use: No     Family Hx: The patient's family history includes Allergic rhinitis in her father and sister; Breast cancer in her maternal aunt; Healthy in her mother. There is no history of Angioedema, Asthma, Atopy, Eczema, Urticaria, or Immunodeficiency.  ROS:   Please see the history of present illness.    ROS  All other systems reviewed and are negative.   Labs/Other Tests and Data Reviewed:    Recent Labs: No results found for requested labs within last 8760 hours.   Recent Lipid Panel No results found for: CHOL, TRIG, HDL, CHOLHDL, LDLCALC, LDLDIRECT  Wt Readings from Last 3 Encounters:  06/01/18 125 lb (56.7 kg)  12/13/17  124 lb 12.8 oz (56.6 kg)  08/09/17 123 lb (55.8 kg)     Exam:    Vital Signs:  BP 110/62 Comment: pt provided  Temp 98.6 F (37 C)   Wt 125 lb (56.7 kg)   LMP 02/11/2012   BMI 22.68 kg/m     Physical Exam  ASSESSMENT & PLAN:     1. Female climacteric state  She will continue with Prog SR 150mg  nightly. I will also call in a refill of Bi-est - we will not change at this time. She will continue with 1 click daily except Sundays. However, when she is due for a refill, I  would like to further decrease the dose of her biest. She is in agreement with her treatment plan. She will also continue with progesterone cream 4% 2 clicks nightly. She agrees to f/u in four months.   2. Anxiety  Chronic. This is of course exacerbated by COVID-19 pandemic. She is encouraged to continue with magnesium supplementation and to practice mindfulness.   3. Seasonal allergic rhinitis due to pollen  Chronic. She will continue with use of OTC meds. She will let me know if her sx persist OR she will discuss further with her PCP.   4. 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 I do like to check on this from time to time. She agrees to do this within six weeks.     COVID-19 Education: The signs and symptoms of COVID-19 were discussed with the patient and how to seek care for testing (follow up with PCP or arrange E-visit).  The importance of social distancing was discussed today.  Patient Risk:   After full review of this patients clinical status, I feel that they are at least moderate risk at this time.  Time:   Today, I have spent 16 minutes/ 13 seconds with the patient with telehealth technology discussing above diagnoses.     Medication Adjustments/Labs and Tests Ordered: Current medicines are reviewed at length with the patient today.  Concerns regarding medicines are outlined above.   Tests Ordered: No orders of the defined types were placed in this encounter.   Medication Changes: No orders of the defined types were placed in this encounter.   Disposition:  Follow up in 4 month(s)  Signed, Maximino Greenland, MD

## 2018-08-03 ENCOUNTER — Ambulatory Visit: Payer: Commercial Managed Care - PPO

## 2018-09-14 ENCOUNTER — Ambulatory Visit: Payer: Commercial Managed Care - PPO

## 2018-09-29 ENCOUNTER — Encounter: Payer: Self-pay | Admitting: Internal Medicine

## 2018-10-03 ENCOUNTER — Ambulatory Visit: Payer: Commercial Managed Care - PPO | Admitting: Internal Medicine

## 2018-10-03 ENCOUNTER — Encounter: Payer: Self-pay | Admitting: Internal Medicine

## 2018-11-01 ENCOUNTER — Other Ambulatory Visit: Payer: Self-pay

## 2018-11-01 ENCOUNTER — Ambulatory Visit
Admission: RE | Admit: 2018-11-01 | Discharge: 2018-11-01 | Disposition: A | Discharge status: Home / Self Care | Payer: Commercial Managed Care - PPO | Source: Ambulatory Visit | Attending: Obstetrics and Gynecology | Admitting: Obstetrics and Gynecology

## 2018-11-01 DIAGNOSIS — Z1231 Encounter for screening mammogram for malignant neoplasm of breast: Secondary | ICD-10-CM

## 2018-11-08 ENCOUNTER — Encounter: Payer: Self-pay | Admitting: Internal Medicine

## 2018-11-10 ENCOUNTER — Encounter: Payer: Self-pay | Admitting: Internal Medicine

## 2018-11-14 ENCOUNTER — Ambulatory Visit: Payer: Commercial Managed Care - PPO | Admitting: Internal Medicine

## 2019-05-16 ENCOUNTER — Telehealth: Payer: Self-pay

## 2019-05-16 NOTE — Telephone Encounter (Signed)
Patient called and needs serum reordered, but her last injection was 01/2019 but the [facility] Virus hit and she wasn't able to come in in 2020 Dermatologist thinks she should have patch testing

## 2019-06-15 ENCOUNTER — Other Ambulatory Visit: Payer: Self-pay

## 2019-06-15 ENCOUNTER — Encounter: Payer: Self-pay | Admitting: Allergy & Immunology

## 2019-06-15 ENCOUNTER — Ambulatory Visit (INDEPENDENT_AMBULATORY_CARE_PROVIDER_SITE_OTHER): Payer: Commercial Managed Care - PPO | Admitting: Allergy & Immunology

## 2019-06-15 VITALS — BP 120/78 | HR 90 | Temp 98.3°F | Ht 65.75 in | Wt 124.0 lb

## 2019-06-15 DIAGNOSIS — J302 Other seasonal allergic rhinitis: Secondary | ICD-10-CM | POA: Diagnosis not present

## 2019-06-15 DIAGNOSIS — J3089 Other allergic rhinitis: Secondary | ICD-10-CM | POA: Diagnosis not present

## 2019-06-15 NOTE — Progress Notes (Signed)
FOLLOW UP  Date of Service/Encounter:  06/15/19   Assessment:   Seasonal and perennial allergic rhinitis (grasses, ragweed, weeds, trees, indoor molds, dust mites and cat)  Plan/Recommendations:   1. Seasonal and perennial allergic rhinitis - Testing today showed: grasses, ragweed, weeds, trees, indoor molds, dust mites and cat (dust mites were the smallest) - Copy of test results provided.  - Avoidance measures provided. - Continue with: nasal saline rinses 1-2 times daily as needed - We can certainly restart your allergy shots if your symptoms are still bothersome. - Discuss the cost with your insurance company to see if you want to pursue this. - I think we could certainly combine these into one vial (leaving out the indoor mold).    2. Return in about 3 months (around 09/14/2019). This can be an in-person, a virtual Webex or a telephone follow up visit.  Subjective:   Kayla Kayla [name] is a 59 y.o. female presenting today for follow up of  Chief Complaint  Patient presents with  . Allergy Testing    Evironmental Testing    Kayla Kayla [name] has a history of the following: Patient Active Problem List   Diagnosis Date Noted  . 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 Special screening for malignant neoplasms, colon 06/10/2016  . ANXIETY 08/25/2007  . ALLERGIC RHINITIS, SEASONAL 08/25/2007  . IBS 08/25/2007    History obtained from: chart review and patient.  Kayla Kayla [name] is a 59 y.o. female presenting for a follow up visit. She was last seen in June 2019. At that time, we decided to space out her allergy shots to every 6 weeks (she receives them at her work's health center). She continued with antihistamines as needed. For her shortness of breath, we continued with the PRN use of albuterol. In the interim, she has completely stopped the allergy shots as of January 2020.   Since the last visit, she has mostly done Kayla [name]. However, she has been socially  distancing and spending a lot of time indoors. She thinks that her immune system has been "boosted" by the allergy shots. She does know that they have definitely helped and it is difficult to get a clear sense of where her symptoms are at this timet.   She was seeing Dr. Benjamine Mola. She went to see him 4-5 years ago before I met Benjamine Mola. 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 She continues to have some congestion and drainage, although again it is not as bad as it was before she started on allergy shots with Dr. Ishmael Holter. This was felt to be environmental in nature. She never had surgery performed to evaluate that septal spur. She is wondering whether she needs to go and see Dr. Benjamine Mola again.   Otherwise, there have been no changes to her past medical history, surgical history, family history, or social history.    Review of Systems  Constitutional: Negative.  Negative for fever, malaise/fatigue and weight loss.  HENT: Positive for congestion and sinus pain. Negative for ear discharge and ear pain.        Positive for postnasal drip.   Eyes: Negative for pain, discharge and redness.  Respiratory: Negative for cough, sputum production, shortness of breath and wheezing.   Cardiovascular: Negative.  Negative for chest pain and palpitations.  Gastrointestinal: Negative for abdominal pain, constipation, diarrhea, heartburn, nausea and vomiting.  Skin: Negative.  Negative for itching and rash.  Neurological: Negative for dizziness and headaches.  Endo/Heme/Allergies:  Negative for environmental allergies. Does not bruise/bleed easily.       Objective:   Blood pressure 120/78, pulse 90, temperature 98.3 F (36.8 C), temperature source Temporal, height 5' 5.75" (1.67 m), weight 124 lb (56.2 kg), last menstrual period 02/11/2012, SpO2 97 %. Body mass index is 20.17 kg/m.   Physical Exam:  Physical Exam  Constitutional: She appears Kayla [name]-developed.  HENT:  Head:  Normocephalic and atraumatic.  Right Ear: Tympanic membrane, external ear and ear canal normal.  Left Ear: Tympanic membrane, external ear and ear canal normal.  Nose: Mucosal edema and rhinorrhea present. No nasal deformity or septal deviation. No epistaxis. Right sinus exhibits no maxillary sinus tenderness and no frontal sinus tenderness. Left sinus exhibits no maxillary sinus tenderness and no frontal sinus tenderness.  Mouth/Throat: Uvula is midline and oropharynx is clear and moist. Mucous membranes are not pale and not dry.  No polyps appreciated.   Eyes: Pupils are equal, round, and reactive to light. Conjunctivae and EOM are normal. Right eye exhibits no chemosis and no discharge. Left eye exhibits no chemosis and no discharge. Right conjunctiva is not injected. Left conjunctiva is not injected.  Cardiovascular: Normal rate, regular rhythm and normal heart sounds.  Respiratory: Effort normal and breath sounds normal. No accessory muscle usage. No tachypnea. No respiratory distress. She has no wheezes. She has no rhonchi. She has no rales. She exhibits no tenderness.  Lymphadenopathy:    She has cervical adenopathy.  Neurological: She is alert.  Skin: No abrasion, no petechiae and no rash noted. Rash is not papular, not vesicular and not urticarial. No erythema. No pallor.  Psychiatric: She has a normal mood and affect.     Diagnostic studies:     Allergy Studies:    Airborne Adult Perc - 06/15/19 1425    Time Antigen Placed  0225    Allergen Manufacturer  Lavella Hammock    Location  Back    Number of Test  59    Panel 1  Select    1. Control-Buffer 50% Glycerol  Negative    2. Control-Histamine 1 mg/ml  2+    3. Albumin saline  Negative    4. [place]  2+    5. [place]  2+    6. [place]  3+    7. [place] Blue  3+    8. Meadow Fescue  3+    9. Perennial Rye  Negative    10. Sweet Vernal  2+    11. Timothy  3+    12. Cocklebur  2+    13. Burweed Marshelder  2+    14. Ragweed,  short  2+    15. Ragweed, Giant  4+    16. Plantain,  English  2+    17. Lamb's Quarters  Negative    18. Sheep Sorrell  Negative    19. Rough Pigweed  Negative    20. Marsh Elder, Rough  Negative    21. Mugwort, Common  2+    22. Ash mix  Negative    23. Birch mix  Negative    24. Beech American  Negative    25. Box, Elder  Negative    26. Cedar, red  3+    27. Cottonwood, [place]  Negative    28. Elm mix  Negative    29. Hickory mix  Negative    30. Maple mix  Negative    31. Oak, [place] mix  Negative    32. Pecan Pollen  Negative    33. Pine mix  Negative    34. Sycamore Eastern  Negative    35. [place], Black Pollen  Negative    36. Alternaria alternata  Negative    37. Cladosporium Herbarum  Negative    38. Aspergillus mix  2+    39. Penicillium mix  Negative    40. Bipolaris sorokiniana (Helminthosporium)  Negative    41. Drechslera spicifera (Curvularia)  Negative    42. Mucor plumbeus  Negative    43. Fusarium moniliforme  Negative    44. Aureobasidium pullulans (pullulara)  Negative    45. Rhizopus oryzae  Negative    46. Botrytis cinera  Negative    47. Epicoccum nigrum  Negative    48. Phoma betae  Negative    49. Candida Albicans  Negative    50. Trichophyton mentagrophytes  Negative    51. Mite, D Farinae  5,000 AU/ml  Negative    52. Mite, D Pteronyssinus  5,000 AU/ml  Negative    53. Cat Hair 10,000 BAU/ml  3+    54.  Dog Epithelia  Negative    55. Mixed Feathers  Negative    56. Horse Epithelia  Negative    57. Cockroach, German  Negative    58. Mouse  Negative    59. Tobacco Leaf  Negative     Intradermal - 06/15/19 1508    Time Antigen Placed  0305    Allergen Manufacturer  Lavella Hammock    Location  Arm    Number of Test  8    Intradermal  Select    Control  Negative    Tree mix  3+    Mold 1  Negative    Mold 3  Negative    Mold 4  Negative    Dog  Negative    Cockroach  Negative    Mite mix  --   +/-      Allergy testing results were read  and interpreted by myself, documented by clinical staff.      Salvatore Marvel, MD  [facility] [place] [facility] [place]

## 2019-06-15 NOTE — Patient Instructions (Addendum)
1. Seasonal and perennial allergic rhinitis - Testing today showed: grasses, ragweed, weeds, trees, indoor molds, dust mites and cat (dust mites were the smallest) - Copy of test results provided.  - Avoidance measures provided. - Continue with: nasal saline rinses 1-2 times daily as needed - Consider allergy shots as a means of long-term control. - Allergy shots "re-train" and "reset" the immune system to ignore environmental allergens and decrease the resulting immune response to those allergens (sneezing, itchy watery eyes, runny nose, nasal congestion, etc).    - Allergy shots improve symptoms in 75-85% of patients.  - We can discuss more at the next appointment if the medications are not working for you.  2. Return in about 3 months (around 09/14/2019). This can be an in-person, a virtual Webex or a telephone follow up visit.  Letter written for [employer] vaccination. Call us when you get the vaccine.    Please inform us of any Emergency Department visits, hospitalizations, or changes in symptoms. Call us before going to the ED for breathing or allergy symptoms since we might be able to fit you in for a sick visit. Feel free to contact us anytime with any questions, problems, or concerns.  It was a pleasure to see you again today!  Websites that have reliable patient information: 1. American Academy of Asthma, Allergy, and Immunology: www.aaaai.org 2. Food Allergy Research and Education (FARE): foodallergy.org 3. Mothers of Asthmatics: http://www.asthmacommunitynetwork.org 4. American College of Allergy, Asthma, and Immunology: www.acaai.org   COVID-19 Vaccine Information can be found at: ShippingScam.co.uk For questions related to vaccine distribution or appointments, please email [email or web address] or call [phone].     "Like" [place] on Facebook and Instagram for our latest updates!       HAPPY SPRING!  Make  sure you are registered to vote! If you have moved or changed any of your contact information, you will need to get this updated before voting!  In some cases, you MAY be able to register to vote online: [email or web address]

## 2019-09-12 ENCOUNTER — Ambulatory Visit: Payer: Commercial Managed Care - PPO | Admitting: Allergy & Immunology

## 2019-10-05 ENCOUNTER — Ambulatory Visit: Payer: Commercial Managed Care - PPO | Admitting: Allergy & Immunology

## 2019-10-08 ENCOUNTER — Other Ambulatory Visit: Payer: Self-pay | Admitting: Obstetrics and Gynecology

## 2019-10-08 DIAGNOSIS — Z1231 Encounter for screening mammogram for malignant neoplasm of breast: Secondary | ICD-10-CM

## 2019-10-31 ENCOUNTER — Ambulatory Visit: Payer: Commercial Managed Care - PPO | Admitting: Allergy & Immunology

## 2019-11-06 ENCOUNTER — Ambulatory Visit
Admission: RE | Admit: 2019-11-06 | Discharge: 2019-11-06 | Disposition: A | Discharge status: Home / Self Care | Payer: Commercial Managed Care - PPO | Source: Ambulatory Visit | Attending: Obstetrics and Gynecology | Admitting: Obstetrics and Gynecology

## 2019-11-06 ENCOUNTER — Other Ambulatory Visit: Payer: Self-pay

## 2019-11-06 DIAGNOSIS — Z1231 Encounter for screening mammogram for malignant neoplasm of breast: Secondary | ICD-10-CM

## 2019-11-08 ENCOUNTER — Other Ambulatory Visit: Payer: Self-pay | Admitting: Obstetrics and Gynecology

## 2019-11-08 DIAGNOSIS — R928 Other abnormal and inconclusive findings on diagnostic imaging of breast: Secondary | ICD-10-CM

## 2019-11-14 ENCOUNTER — Ambulatory Visit
Admission: RE | Admit: 2019-11-14 | Discharge: 2019-11-14 | Disposition: A | Discharge status: Home / Self Care | Payer: Commercial Managed Care - PPO | Source: Ambulatory Visit | Attending: Obstetrics and Gynecology | Admitting: Obstetrics and Gynecology

## 2019-11-14 ENCOUNTER — Other Ambulatory Visit: Payer: Self-pay

## 2019-11-14 DIAGNOSIS — R928 Other abnormal and inconclusive findings on diagnostic imaging of breast: Secondary | ICD-10-CM

## 2019-11-30 ENCOUNTER — Ambulatory Visit: Payer: Commercial Managed Care - PPO | Admitting: Allergy & Immunology

## 2019-12-05 ENCOUNTER — Encounter: Payer: Self-pay | Admitting: Allergy & Immunology

## 2019-12-05 ENCOUNTER — Ambulatory Visit (INDEPENDENT_AMBULATORY_CARE_PROVIDER_SITE_OTHER): Payer: Commercial Managed Care - PPO | Admitting: Allergy & Immunology

## 2019-12-05 ENCOUNTER — Other Ambulatory Visit: Payer: Self-pay

## 2019-12-05 VITALS — BP 110/64 | HR 90 | Resp 16

## 2019-12-05 DIAGNOSIS — J3089 Other allergic rhinitis: Secondary | ICD-10-CM

## 2019-12-05 DIAGNOSIS — J302 Other seasonal allergic rhinitis: Secondary | ICD-10-CM | POA: Diagnosis not present

## 2019-12-05 NOTE — Progress Notes (Signed)
FOLLOW UP  Date of Service/Encounter:  12/05/19   Assessment:   Seasonal and perennial allergic rhinitis (grasses, ragweed, weeds, trees, indoor molds, dust mites and cat)  Fully vaccinated to [place] [employer])  Plan/Recommendations:   1. Seasonal and perennial allergic rhinitis (grasses, ragweed, weeds, trees, indoor molds, dust mites and cat) - We will go ahead and start allergy shots to prepare you for upcoming spring season.  - Continue with: nasal saline rinses 1-2 times daily as needed - Use an antihistamine on the days of your injections. - Makes an appointment to start shots in one month or so.   2. Return in about 6 months (around 06/04/2020).   Total of 45 minutes, greater than 50% of which was spent in discussion of treatment and management options.    Subjective:   Kayla [name] is a 59 y.o. female presenting today for follow up of  Chief Complaint  Patient presents with  . Allergic Rhinitis     patient is interested in doing allergy injections. she does have a consent packet with her. would like to keep to one vial if possible.     Kayla [name] has a history of the following: Patient Active Problem List   Diagnosis Date Noted  . 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 Special screening for malignant neoplasms, colon 06/10/2016  . ANXIETY 08/25/2007  . ALLERGIC RHINITIS, SEASONAL 08/25/2007  . IBS 08/25/2007    History obtained from: chart review and patient.  Kayla [name] is a 59 y.o. female presenting for a follow up visit.  She has a history of environmental allergies.  She was last seen in April 2021 when she had testing that was positive.,  Ragweed, weeds, trees, indoor molds, dust mite, and cat.  We continue with nasal saline rinses 1-2 times daily as needed.  Her symptoms seem to be well controlled, so we did not worry about starting allergy shots.  She had previously been on allergy shots through her health clinic at work.   She was receiving them every 6 weeks.  She has been on antihistamines as needed.  She also has a history of asthma and was on as needed albuterol.  Her last allergy shot had been in January 2020.  Since the last visit, she has mostly done well. But she is interested in restarting allergy shots but has a lot of questions about it. She has a lot of postnasal drip and she does not like using medications at all. She was previously on allergy shots and seemed to do better with this. She does not use antihistamines or nasal sprays routinely. She does use nasal saline rinses twice daily and is wondering what I think about the [place].   She has not gone back to see Dr. Benjamine Mola, but she did bring in her clinic note from her visit in 2014. She was told that the surgery for the septal spur might help her symptoms, but she is wondering my opinion. Reviewing her symptoms, it seems that she has both congestion and postnasal drip. She does not feel as if she has trouble breathing through her nose.   During that visit with Dr. Benjamine Mola, she was also diagnosed with reflux. She was on omeprazole for a period of time, but she she of course does not like medications, therefore she did not remain on it.   Otherwise, there have been no changes to her past medical history, surgical history, family history, or social history.  Review of Systems  Constitutional: Negative.  Negative for chills, fever, malaise/fatigue and weight loss.  HENT: Positive for congestion. Negative for ear discharge, ear pain and sinus pain.        Positive for postnasal drip.  Eyes: Negative for pain, discharge and redness.  Respiratory: Negative for cough, sputum production, shortness of breath and wheezing.   Cardiovascular: Negative.  Negative for chest pain and palpitations.  Gastrointestinal: Negative for abdominal pain, constipation, diarrhea, heartburn, nausea and vomiting.  Skin: Negative.  Negative for itching and rash.  Neurological:  Negative for dizziness and headaches.  Endo/Heme/Allergies: Positive for environmental allergies. Does not bruise/bleed easily.       Objective:   Blood pressure 110/64, pulse 90, resp. rate 16, last menstrual period 02/11/2012, SpO2 97 %. There is no height or weight on file to calculate BMI.   Physical Exam:  Physical Exam Constitutional:      Appearance: She is well-developed.  HENT:     Head: Normocephalic and atraumatic.     Right Ear: Tympanic membrane, ear canal and external ear normal.     Left Ear: Tympanic membrane, ear canal and external ear normal.     Nose: No nasal deformity, septal deviation, mucosal edema or rhinorrhea.     Right Turbinates: Enlarged, swollen and pale.     Left Turbinates: Enlarged, swollen and pale.     Right Sinus: No maxillary sinus tenderness or frontal sinus tenderness.     Left Sinus: No maxillary sinus tenderness or frontal sinus tenderness.     Comments: There is postnasal drip present. She does have moderate cobblestoning in the posterior oropharynx.     Mouth/Throat:     Mouth: Mucous membranes are not pale and not dry.     Pharynx: Uvula midline.  Eyes:     General:        Right eye: No discharge.        Left eye: No discharge.     Conjunctiva/sclera: Conjunctivae normal.     Right eye: Right conjunctiva is not injected. No chemosis.    Left eye: Left conjunctiva is not injected. No chemosis.    Pupils: Pupils are equal, round, and reactive to light.  Cardiovascular:     Rate and Rhythm: Normal rate and regular rhythm.     Heart sounds: Normal heart sounds.  Pulmonary:     Effort: Pulmonary effort is normal. No tachypnea, accessory muscle usage or respiratory distress.     Breath sounds: Normal breath sounds. No wheezing, rhonchi or rales.  Chest:     Chest wall: No tenderness.  Lymphadenopathy:     Cervical: No cervical adenopathy.  Skin:    Coloration: Skin is not pale.     Findings: No abrasion, erythema, petechiae or  rash. Rash is not papular, urticarial or vesicular.     Comments: No eczematous or urticarial lesions noted.   Neurological:     Mental Status: She is alert.      Diagnostic studies: none      Salvatore Marvel, MD  [facility]

## 2019-12-05 NOTE — Patient Instructions (Addendum)
1. Seasonal and perennial allergic rhinitis (grasses, ragweed, weeds, trees, indoor molds, dust mites and cat) - We will go ahead and start allergy shots to prepare you for upcoming spring season.  - Continue with: nasal saline rinses 1-2 times daily as needed - Use an antihistamine on the days of your injections. - Makes an appointment to start shots in one month or so.   2. Return in about 6 months (around 06/04/2020).     Please inform us of any Emergency Department visits, hospitalizations, or changes in symptoms. Call us before going to the ED for breathing or allergy symptoms since we might be able to fit you in for a sick visit. Feel free to contact us anytime with any questions, problems, or concerns.  It was a pleasure to see you and your family again today!  Websites that have reliable patient information: 1. American Academy of Asthma, Allergy, and Immunology: www.aaaai.org 2. Food Allergy Research and Education (FARE): foodallergy.org 3. Mothers of Asthmatics: http://www.asthmacommunitynetwork.org 4. American College of Allergy, Asthma, and Immunology: www.acaai.org   COVID-19 Vaccine Information can be found at: ShippingScam.co.uk For questions related to vaccine distribution or appointments, please email [email or web address] or call [phone].     "Like" [place] on Facebook and Instagram for our latest updates!     HAPPY FALL!     Make sure you are registered to vote! If you have moved or changed any of your contact information, you will need to get this updated before voting!  In some cases, you MAY be able to register to vote online: [email or web address]

## 2019-12-06 ENCOUNTER — Encounter: Payer: Self-pay | Admitting: Allergy & Immunology

## 2019-12-10 DIAGNOSIS — J3089 Other allergic rhinitis: Secondary | ICD-10-CM | POA: Diagnosis not present

## 2019-12-10 NOTE — Progress Notes (Signed)
VIALS EXP 12-09-20

## 2020-01-02 ENCOUNTER — Other Ambulatory Visit: Payer: Self-pay

## 2020-01-02 ENCOUNTER — Ambulatory Visit (INDEPENDENT_AMBULATORY_CARE_PROVIDER_SITE_OTHER): Payer: Commercial Managed Care - PPO

## 2020-01-02 DIAGNOSIS — J309 Allergic rhinitis, unspecified: Secondary | ICD-10-CM

## 2020-01-02 MED ORDER — EPINEPHRINE 0.3 MG/0.3ML IJ SOAJ: 0.3000 mg | INTRAMUSCULAR | 2 refills | Status: DC | PRN

## 2020-01-02 NOTE — Progress Notes (Signed)
Immunotherapy   Patient Details  Name: Kayla [name] MRN: [id number] Date of Birth: 30-Jul-1960  01/02/2020  [name] here to start allergy injections and take out vials. Patient received 0.05 of her blue vial with G-W-T-C-DM with an expiration of 12/09/2020. Patient waited 30 minutes with no problems. She receives ITX through [employer] where she works. Following schedule:  A Frequency: Weekly Epi-Pen: Yes Consent signed and patient instructions given.   Herbie Drape 01/02/2020, 2:11 PM

## 2020-04-30 ENCOUNTER — Ambulatory Visit (INDEPENDENT_AMBULATORY_CARE_PROVIDER_SITE_OTHER): Payer: Commercial Managed Care - PPO | Admitting: *Deleted

## 2020-04-30 ENCOUNTER — Other Ambulatory Visit: Payer: Self-pay

## 2020-04-30 DIAGNOSIS — J309 Allergic rhinitis, unspecified: Secondary | ICD-10-CM

## 2020-04-30 NOTE — Progress Notes (Signed)
Immunotherapy   Patient Details  Name: Kayla [name] MRN: [id number] Date of Birth: 1960/10/26  04/30/2020  Kayla [name] here to pick up  G-W-T-C-DM- Kayla [name]  Following schedule: A  Frequency:2 times per week Epi-Pen:Epi-Pen Available  Consent signed and patient instructions given. Patient received .7mL of G-W-T-C-DM in the LUA and waited 30 minutes in office without issue. Patient picked up vial to continue injection at [place] at her place of work. Injection record has been labeled and placed in bulk scanning.    Mairin Lindsley Fernandez-Vernon 04/30/2020, 5:07 PM

## 2020-06-06 ENCOUNTER — Ambulatory Visit (INDEPENDENT_AMBULATORY_CARE_PROVIDER_SITE_OTHER): Payer: Commercial Managed Care - PPO | Admitting: *Deleted

## 2020-06-06 ENCOUNTER — Other Ambulatory Visit: Payer: Self-pay

## 2020-06-06 DIAGNOSIS — J309 Allergic rhinitis, unspecified: Secondary | ICD-10-CM

## 2020-06-06 NOTE — Progress Notes (Signed)
Immunotherapy   Patient Details  Name: Kayla [name] MRN: [id number] Date of Birth: 1960-07-22  06/06/2020  [name] here to pick up  G-W-T-C-DM Following schedule: A  Frequency:1 time per week Epi-Pen:Epi-Pen Available  Consent signed and patient instructions given. Patient received 0.2mL of G-W-T-C-DM in the RUA and waited 30 minutes office without experiencing any issues. Patient picked up vial to continue her injection at [facility] at her placed of work. Injection record has been labeled and placed in bulk scanning.    Davidjames Blansett Fernandez-Vernon 06/06/2020, 11:11 AM

## 2020-07-02 DIAGNOSIS — J302 Other seasonal allergic rhinitis: Secondary | ICD-10-CM | POA: Insufficient documentation

## 2020-07-07 ENCOUNTER — Other Ambulatory Visit: Payer: Self-pay | Admitting: Orthopedic Surgery

## 2020-07-07 DIAGNOSIS — M79641 Pain in right hand: Secondary | ICD-10-CM

## 2020-07-07 DIAGNOSIS — R2231 Localized swelling, mass and lump, right upper limb: Secondary | ICD-10-CM

## 2020-07-18 ENCOUNTER — Other Ambulatory Visit: Payer: Self-pay

## 2020-07-18 ENCOUNTER — Ambulatory Visit
Admission: RE | Admit: 2020-07-18 | Discharge: 2020-07-18 | Disposition: A | Discharge status: Home / Self Care | Payer: Commercial Managed Care - PPO | Source: Ambulatory Visit | Attending: Orthopedic Surgery | Admitting: Orthopedic Surgery

## 2020-07-18 DIAGNOSIS — R2231 Localized swelling, mass and lump, right upper limb: Secondary | ICD-10-CM

## 2020-07-18 DIAGNOSIS — M79641 Pain in right hand: Secondary | ICD-10-CM

## 2020-07-22 NOTE — Patient Instructions (Addendum)
1. Seasonal and perennial allergic rhinitis (grasses, ragweed, weeds, trees, indoor molds, dust mites and cat) - Start with: nasal saline rinses 1-2 times daily as needed -Continue with: Use an antihistamine on the days of your injections.  2. Possible contact dermatitis Consider true patch test in the future.  Patches are applied on a month day and read on a Wednesday and Friday.  You would need to be off all steroids approximately 4 weeks prior.  It is okay to be on antihistamines during the patch test. Continue the cream as prescribed by your dermatologist   Schedule a follow up appointment in 6 months or sooner

## 2020-07-23 ENCOUNTER — Other Ambulatory Visit: Payer: Self-pay

## 2020-07-23 ENCOUNTER — Ambulatory Visit (INDEPENDENT_AMBULATORY_CARE_PROVIDER_SITE_OTHER): Payer: Commercial Managed Care - PPO | Admitting: Family

## 2020-07-23 ENCOUNTER — Encounter: Payer: Self-pay | Admitting: Family

## 2020-07-23 VITALS — BP 134/66 | HR 79 | Temp 97.9°F | Resp 18

## 2020-07-23 DIAGNOSIS — J302 Other seasonal allergic rhinitis: Secondary | ICD-10-CM | POA: Diagnosis not present

## 2020-07-23 DIAGNOSIS — L259 Unspecified contact dermatitis, unspecified cause: Secondary | ICD-10-CM | POA: Diagnosis not present

## 2020-07-23 DIAGNOSIS — J309 Allergic rhinitis, unspecified: Secondary | ICD-10-CM

## 2020-07-23 NOTE — Progress Notes (Signed)
Immunotherapy   Patient Details  Name: CHAI VERDEJO MRN: [id number] Date of Birth: 24-Sep-1960  07/23/2020  [name] here to pick up  First Red vial of G-W-T-C-DM Following schedule: A  Frequency:1 time per week Epi-Pen:Epi-Pen Available  Consent signed and patient instructions given. Patient came in and received 0.74mL of her first Red vial of G-W-T-C-DM in the [facility]. Patient waited 30 minutes in office and did not experience any issues. Patient picked up her vial to continue her injections at her job. Patient dropped of injection record for her Green vial, this has been labeled and placed in bulk scanning.   Tabitha Tupper Fernandez-Vernon 07/23/2020, 11:51 AM

## 2020-07-23 NOTE — Progress Notes (Signed)
[place], [place] [address] Dept: [phone]  FOLLOW UP NOTE  Patient ID: Kayla [name], female    DOB: [birth year]  Age: 60 y.o. MRN: [id number] Date of Office Visit: 07/23/2020  Assessment  Chief Complaint: Follow-up and Nasal Congestion  HPI Kayla [name] is a 60 year old female who presents today for follow-up of seasonal and perennial allergic rhinitis.  She was last seen on December 13, 2019 by Dr. Ernst Bowler.  Seasonal and perennial allergic rhinitis is reported as moderately controlled with Allegra on injection days and as needed, saline gel as needed, and saline spray as needed.  She reports nasal congestion at times, a lot of postnasal drip in the morning, and nasal dryness.  She continues to receive allergy injections at work and reports that her EpiPen as up-to-date.  She denies any large local reactions and she does feel like her allergy injections are helping her symptoms get better.  She reports that she has a tendency to get epistaxis from nose sprays and also nose sprays will cause a sore throat.  She will also occasionally use a saline rinse.  She has not had any sinus infections since we last saw her.  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  She also mentions that she will sporadically get an itchy rash on her face/jaw line.  She describes it as almost looking like hives or bumps.  She has seen dermatology for this and prescribed Cloderm and reports that this helps.  She does not have any pictures of this rash and she does not currently have a rash today while in the office.  She asked about possible patch testing.  She reports that when she has these hives/bumps that she has not made a change in products, no new medications, or bug bites.  She does also not recognize a correlation with foods.  She denies any swelling when the hive/bumps occur.  She denies any fever, joint pain, or residual  bruising on the hives/bumps go away.  The hives/bumps are itchy and usually go away the same day or the next day.  She denies any concomitant cardiorespiratory or gastrointestinal symptoms.   Drug Allergies:  Allergies  Allergen Reactions  . Sulfonamide Derivatives   . Sulfa Antibiotics Rash    Review of Systems: Review of Systems  Constitutional: Negative for chills and fever.  HENT:       Reports nasal dryness, nasal congestion at times, and a lot of postnasal drip in the morning.  She denies any rhinorrhea.  She has not had any sinus infections since we last saw her.  Eyes:       Reports occasional itchy watery eyes.  She does have eyedrops that help with this.  She also reports that she has dry eyes for which she uses a wetting agent  Respiratory: Negative for cough, shortness of breath and wheezing.   Cardiovascular: Negative for chest pain and palpitations.  Gastrointestinal: Negative for heartburn.  Genitourinary: Negative for dysuria.  Skin: Negative for itching and rash.  Neurological: Negative for headaches.  Endo/Heme/Allergies: Positive for environmental allergies.    Physical Exam: BP 134/66 (BP Location: Left Arm, Patient Position: Sitting, Cuff Size: Normal)   Pulse 79   Temp 97.9 F (36.6 C) (Temporal)   Resp 18   LMP 02/11/2012   SpO2 100%    Physical Exam Constitutional:      Appearance: Normal  appearance.  HENT:     Head: Normocephalic and atraumatic.     Comments: Pharynx normal, eyes normal, ears normal, nose normal    Right Ear: Tympanic membrane, ear canal and external ear normal.     Left Ear: Tympanic membrane, ear canal and external ear normal.     Nose: Nose normal.     Mouth/Throat:     Mouth: Mucous membranes are moist.     Pharynx: Oropharynx is clear.  Eyes:     Conjunctiva/sclera: Conjunctivae normal.  Cardiovascular:     Rate and Rhythm: Regular rhythm.     Heart sounds: Normal heart sounds.  Pulmonary:     Effort: Pulmonary effort  is normal.     Breath sounds: Normal breath sounds.     Comments: Lungs clear to auscultation Musculoskeletal:     Cervical back: Neck supple.  Skin:    General: Skin is warm.  Neurological:     Mental Status: She is alert and oriented to person, place, and time.  Psychiatric:        Mood and Affect: Mood normal.        Behavior: Behavior normal.        Thought Content: Thought content normal.        Judgment: Judgment normal.     Diagnostics: None  Assessment and Plan: 1. Seasonal and perennial allergic rhinitis   2. Contact dermatitis, unspecified contact dermatitis type, unspecified trigger     No orders of the defined types were placed in this encounter.   Patient Instructions  1. Seasonal and perennial allergic rhinitis (grasses, ragweed, weeds, trees, indoor molds, dust mites and cat) - Start with: nasal saline rinses 1-2 times daily as needed -Continue with: Use an antihistamine on the days of your injections.  2. Possible contact dermatitis Consider true patch test in the future.  Patches are applied on a month day and read on a Wednesday and Friday.  You would need to be off all steroids approximately 4 weeks prior.  It is okay to be on antihistamines during the patch test. Continue the cream as prescribed by your dermatologist   Schedule a follow up appointment in 6 months or sooner      Return in about 6 months (around 01/22/2021), or if symptoms worsen or fail to improve.    Thank you for the opportunity to care for this patient.  Please do not hesitate to contact me with questions.  Althea Charon, FNP [facility] [place]

## 2020-07-24 NOTE — Progress Notes (Signed)
VIAL MADE. EXP 07-24-21

## 2020-07-28 DIAGNOSIS — J3089 Other allergic rhinitis: Secondary | ICD-10-CM | POA: Diagnosis not present

## 2020-08-04 ENCOUNTER — Other Ambulatory Visit: Payer: Self-pay

## 2020-08-04 ENCOUNTER — Encounter: Payer: Self-pay | Admitting: Allergy

## 2020-08-04 ENCOUNTER — Ambulatory Visit (INDEPENDENT_AMBULATORY_CARE_PROVIDER_SITE_OTHER): Payer: Commercial Managed Care - PPO | Admitting: Allergy

## 2020-08-04 VITALS — BP 118/70 | HR 79 | Temp 97.1°F | Resp 18 | Ht 65.0 in | Wt 123.0 lb

## 2020-08-04 DIAGNOSIS — J3089 Other allergic rhinitis: Secondary | ICD-10-CM | POA: Diagnosis not present

## 2020-08-04 DIAGNOSIS — J302 Other seasonal allergic rhinitis: Secondary | ICD-10-CM

## 2020-08-04 DIAGNOSIS — L239 Allergic contact dermatitis, unspecified cause: Secondary | ICD-10-CM | POA: Diagnosis not present

## 2020-08-04 NOTE — Progress Notes (Signed)
   Follow Up Note  RE: Kayla [name] MRN: [id number] DOB: June 05, 1960 Date of Office Visit: 08/04/2020  Referring provider: Asencion Noble, MD Primary care provider: Asencion Noble, MD  History of Present Illness: I had the pleasure of seeing Kayla [name] for a follow up visit at the [facility] on 08/04/2020. She is a 60 y.o. female, who is being followed for dermatitis and allergic rhinitis. Today she is here for patch test placement, given suspected history of contact dermatitis.   Diagnostics: TRUE Test patches placed.   Assessment and Plan: Kayla [name] is a 60 y.o. female with: Allergic contact dermatitis TRUE patches placed.  The patient was instructed regarding proper care of the patches for the next 48 hours. Do not get patches wet - avoid showering until the next visit. Do not engage in vigorous physical activity. Patient will follow up in 48 hours and 96 hours for patch readings.  It was my pleasure to see Kayla [name] today and participate in her care. Please feel free to contact me with any questions or concerns.  Sincerely,  Rexene Alberts, DO Allergy & Immunology  [facility] office: [phone] [facility] office: [place] office: [phone]

## 2020-08-04 NOTE — Patient Instructions (Signed)
Patches placed today. Please avoid strenuous physical activities and do not get the patches on the back wet. No showering until final patch reading done. Okay to take antihistamines for itching but avoid placing any creams on the back where the patches are. We will remove the patches on Wednesday and will do our initial read. Then you will come back on Friday for a final read. 

## 2020-08-04 NOTE — Assessment & Plan Note (Signed)
TRUE patches placed.

## 2020-08-06 ENCOUNTER — Other Ambulatory Visit: Payer: Self-pay

## 2020-08-06 ENCOUNTER — Ambulatory Visit: Payer: Commercial Managed Care - PPO | Admitting: Allergy & Immunology

## 2020-08-06 DIAGNOSIS — L230 Allergic contact dermatitis due to metals: Secondary | ICD-10-CM

## 2020-08-06 NOTE — Progress Notes (Signed)
    Follow-up Note  RE: Kayla [name] MRN: [id number] DOB: 07/10/1960 Date of Office Visit: 08/06/2020  Primary care provider: Asencion Noble, MD Referring provider: Asencion Noble, MD   Nadelyn returns to the office today for the initial patch test interpretation, given suspected history of contact dermatitis.    Diagnostics:   TRUE TEST 48-hour reading:  2+ reaction to #28 (Gold sodium thiosulfate), otherwise negative   Plan:   Allergic contact dermatitis - The patient has been provided detailed information regarding the substances she is sensitive to, as well as products containing the substances.   - Meticulous avoidance of these substances is recommended.  - If avoidance is not possible, the use of barrier creams or lotions is recommended. - If symptoms persist or progress despite meticulous avoidance of gold, Dermatology Referral may be warranted.   Salvatore Marvel, MD  [facility] [place] [place]

## 2020-08-06 NOTE — Patient Instructions (Signed)
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 - Some things take several days to react, however.  - Information on gold allergy provided.  2. No follow-ups on file.    Please inform us of any Emergency Department visits, hospitalizations, or changes in symptoms. Call us before going to the ED for breathing or allergy symptoms since we might be able to fit you in for a sick visit. Feel free to contact us anytime with any questions, problems, or concerns.  It was a pleasure to see you again today!  Websites that have reliable patient information: 1. American Academy of Asthma, Allergy, and Immunology: www.aaaai.org 2. Food Allergy Research and Education (FARE): foodallergy.org 3. Mothers of Asthmatics: http://www.asthmacommunitynetwork.org 4. American College of Allergy, Asthma, and Immunology: www.acaai.org   COVID-19 Vaccine Information can be found at: ShippingScam.co.uk For questions related to vaccine distribution or appointments, please email [email or web address] or call [phone].   We realize that you might be concerned about having an allergic reaction to the COVID19 vaccines. To help with that concern, WE ARE OFFERING THE COVID19 VACCINES IN OUR OFFICE! Ask the front desk for dates!     "Like" [place] on Facebook and Instagram for our latest updates!      A healthy democracy works best when [employer] participate! Make sure you are registered to vote! If you have moved or changed any of your contact information, you will need to get this updated before voting!  In some cases, you MAY be able to register to vote online: [email or web address]    2

## 2020-08-08 ENCOUNTER — Other Ambulatory Visit: Payer: Self-pay

## 2020-08-08 ENCOUNTER — Encounter: Payer: Self-pay | Admitting: Allergy & Immunology

## 2020-08-08 ENCOUNTER — Ambulatory Visit: Payer: Commercial Managed Care - PPO | Admitting: Allergy & Immunology

## 2020-08-08 DIAGNOSIS — L259 Unspecified contact dermatitis, unspecified cause: Secondary | ICD-10-CM

## 2020-08-08 NOTE — Progress Notes (Signed)
    Follow-up Note  RE: Kayla [name] MRN: [id number] DOB: July 23, 1960 Date of Office Visit: 08/08/2020  Primary care provider: Asencion Noble, MD Referring provider: Asencion Noble, MD   [name] returns to the office today for the final patch test interpretation, given suspected history of contact dermatitis.    Diagnostics:   TRUE TEST 96-hour reading:  1+ reaction to #15 (Carba mix), 1+ reaction to #20 (p-Phenylene-diamine), and 3+ reaction to #28 (Gold sodium thiosulfate)   Plan:   Allergic contact dermatitis - The patient has been provided detailed information regarding the substances she is sensitive to, as well as products containing the substances.   - Meticulous avoidance of these substances is recommended.  - If avoidance is not possible, the use of barrier creams or lotions is recommended. - If symptoms persist or progress despite meticulous avoidance of the above chemicals, Dermatology Referral may be warranted.   Salvatore Marvel, MD  [facility] [place] [facility] [place]

## 2020-08-08 NOTE — Patient Instructions (Addendum)
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 - Information on these new allergens provided. - Start Opzelura one application twice daily as needed (SAFE to use on the face since this is not a steroid).   2. Follow up as scheduled.    Please inform us of any Emergency Department visits, hospitalizations, or changes in symptoms. Call us before going to the ED for breathing or allergy symptoms since we might be able to fit you in for a sick visit. Feel free to contact us anytime with any questions, problems, or concerns.  It was a pleasure to see you again today!  Websites that have reliable patient information: 1. American Academy of Asthma, Allergy, and Immunology: www.aaaai.org 2. Food Allergy Research and Education (FARE): foodallergy.org 3. Mothers of Asthmatics: http://www.asthmacommunitynetwork.org 4. American College of Allergy, Asthma, and Immunology: www.acaai.org   COVID-19 Vaccine Information can be found at: ShippingScam.co.uk For questions related to vaccine distribution or appointments, please email vaccine@[facility] .com or call [phone].   We realize that you might be concerned about having an allergic reaction to the COVID19 vaccines. To help with that concern, WE ARE OFFERING THE COVID19 VACCINES IN OUR OFFICE! Ask the front desk for dates!     "Like" [place] on Facebook and Instagram for our latest updates!      A healthy democracy works best when [employer] participate! Make sure you are registered to vote! If you have moved or changed any of your contact information, you will need to get this updated before voting!  In some cases, you MAY be able to register to vote online: [email or web address]    2

## 2020-08-12 ENCOUNTER — Encounter: Payer: Self-pay | Admitting: Allergy & Immunology

## 2020-08-12 MED ORDER — OPZELURA 1.5 % EX CREA: 1.0000 "application " | TOPICAL_CREAM | CUTANEOUS | 2 refills | Status: DC | PRN

## 2020-08-13 ENCOUNTER — Encounter: Payer: Self-pay | Admitting: Allergy & Immunology

## 2020-08-13 ENCOUNTER — Telehealth: Payer: Self-pay

## 2020-08-13 MED ORDER — TRIAMCINOLONE ACETONIDE 0.5 % EX OINT: 1.0000 "application " | TOPICAL_OINTMENT | Freq: Two times a day (BID) | CUTANEOUS | 0 refills | Status: DC

## 2020-08-13 NOTE — Telephone Encounter (Signed)
I was out of office last week and did not read any of her patch test results. Please call and see what questions she has.

## 2020-08-13 NOTE — Telephone Encounter (Signed)
Thanks

## 2020-08-13 NOTE — Telephone Encounter (Signed)
Spoke with patient.  Patient stated she has questions concerning the results and the prescriptions.  When asked what questions she had patient stated she was at work and would call back.

## 2020-08-13 NOTE — Telephone Encounter (Signed)
The spot on the back is likely the gold, which was highly reactive. Is this the same area where her patch testing was positive? If so, she can use triamcinolone 0.5% ointment on that. I sent to [facility] in the system for her.    Again I do not think that her rash was related to the allergy shots. She can continue on the same build up. If the rash was around the injection site, that is a different story and we can change her build up.   Opzelura should be covered with the copay card. We will keep trying. If not, continue to use the samples of Eucrisa, which you can also safely use on the face.  I will send her a safe list of cosmetics/products. Please get her email address so that I can send it to her.   Salvatore Marvel, MD [facility] [place] [facility] [place]

## 2020-08-13 NOTE — Telephone Encounter (Signed)
Patient called requesting to speak to Chrissie regarding her final patch read appointment on 08/08/2020.  Please Advise

## 2020-08-13 NOTE — Telephone Encounter (Signed)
Patient returned the call.  Patient stated she has a red spot on her back where her reaction was.  Patient stated she went from .05-.10 on Wednesday at the facility she take her shots.  She is on her maintenance to build up.  Patient stated she will send a message in mychart concerning questions she has about her last visit for her patch testing.  Patient is questioning the Opzelura because her insurance does not cover it.

## 2020-08-19 ENCOUNTER — Telehealth: Payer: Self-pay | Admitting: *Deleted

## 2020-08-19 NOTE — Telephone Encounter (Signed)
Patient called and stated that the place where she gets her allergy injections is changing their policy and they don't want to administer her allergy injections unless she is on maintenance. I advised for the patient to have the place call our office to discuss this with [place] to see if we can get things worked out. Patient verbalized understanding and she is going to have them call tomorrow.

## 2020-08-21 NOTE — Telephone Encounter (Signed)
Yes mam I spoke with the Nurse Practitioner yesterday and explained to her the process of building up and maintenance and she stated that she would need to talk to Dianna Rossetti (the company) and more than likely it would be fine for her to continue her injections there. She stated that she will call back with an update.

## 2020-08-21 NOTE — Telephone Encounter (Signed)
Hi Kayla [name] I know you spoke with someone in regards to this yesterday. What was the out come?

## 2020-08-22 NOTE — Telephone Encounter (Signed)
Noted,  Thank you!

## 2020-09-10 ENCOUNTER — Ambulatory Visit (INDEPENDENT_AMBULATORY_CARE_PROVIDER_SITE_OTHER): Payer: Commercial Managed Care - PPO | Admitting: *Deleted

## 2020-09-10 DIAGNOSIS — J309 Allergic rhinitis, unspecified: Secondary | ICD-10-CM | POA: Diagnosis not present

## 2020-09-17 ENCOUNTER — Ambulatory Visit (INDEPENDENT_AMBULATORY_CARE_PROVIDER_SITE_OTHER): Payer: Commercial Managed Care - PPO

## 2020-09-17 DIAGNOSIS — J309 Allergic rhinitis, unspecified: Secondary | ICD-10-CM | POA: Diagnosis not present

## 2020-09-24 ENCOUNTER — Telehealth: Payer: Self-pay | Admitting: *Deleted

## 2020-09-24 ENCOUNTER — Ambulatory Visit (INDEPENDENT_AMBULATORY_CARE_PROVIDER_SITE_OTHER): Payer: Commercial Managed Care - PPO | Admitting: *Deleted

## 2020-09-24 DIAGNOSIS — J309 Allergic rhinitis, unspecified: Secondary | ICD-10-CM | POA: Diagnosis not present

## 2020-09-24 NOTE — Telephone Encounter (Signed)
Patient received samples of Eucrisa at some point and really like it better than the [place]. She was wondering if you could switch to that and receive a prescription?

## 2020-09-25 ENCOUNTER — Encounter: Payer: Self-pay | Admitting: Allergy & Immunology

## 2020-09-25 NOTE — Telephone Encounter (Signed)
Patient has sent a mychart message on 09/25/2020. I have reached out via mychart to see what pharmacy she would like that sent to. Please refer to mychart message for 09/25/2020 for more information.

## 2020-09-25 NOTE — Telephone Encounter (Signed)
I am fine with [place].  Thanks, Salvatore Marvel, MD [facility] [place] [facility] [place]

## 2020-09-26 ENCOUNTER — Other Ambulatory Visit: Payer: Self-pay | Admitting: Allergy & Immunology

## 2020-09-26 MED ORDER — EUCRISA 2 % EX OINT: 1.0000 "application " | TOPICAL_OINTMENT | Freq: Two times a day (BID) | CUTANEOUS | 5 refills | Status: DC | PRN

## 2020-09-26 NOTE — Addendum Note (Signed)
Addended by: Chip Boer R on: 09/26/2020 04:23 PM   Modules accepted: Orders

## 2020-09-29 NOTE — Telephone Encounter (Signed)
Please advise to change from eucrisa to elidel 1% cream

## 2020-10-03 ENCOUNTER — Other Ambulatory Visit: Payer: Self-pay | Admitting: Allergy & Immunology

## 2020-10-03 ENCOUNTER — Ambulatory Visit (INDEPENDENT_AMBULATORY_CARE_PROVIDER_SITE_OTHER): Payer: Commercial Managed Care - PPO

## 2020-10-03 ENCOUNTER — Other Ambulatory Visit: Payer: Self-pay | Admitting: *Deleted

## 2020-10-03 DIAGNOSIS — J309 Allergic rhinitis, unspecified: Secondary | ICD-10-CM

## 2020-10-03 MED ORDER — EUCRISA 2 % EX OINT
1.0000 | TOPICAL_OINTMENT | Freq: Two times a day (BID) | CUTANEOUS | 5 refills | Status: DC
Start: 2020-10-03 — End: 2021-08-14

## 2020-10-08 ENCOUNTER — Ambulatory Visit (INDEPENDENT_AMBULATORY_CARE_PROVIDER_SITE_OTHER): Payer: Commercial Managed Care - PPO

## 2020-10-08 DIAGNOSIS — J309 Allergic rhinitis, unspecified: Secondary | ICD-10-CM

## 2020-10-15 ENCOUNTER — Ambulatory Visit (INDEPENDENT_AMBULATORY_CARE_PROVIDER_SITE_OTHER): Payer: Commercial Managed Care - PPO

## 2020-10-15 DIAGNOSIS — J309 Allergic rhinitis, unspecified: Secondary | ICD-10-CM

## 2020-10-24 ENCOUNTER — Ambulatory Visit (INDEPENDENT_AMBULATORY_CARE_PROVIDER_SITE_OTHER): Payer: Commercial Managed Care - PPO

## 2020-10-24 DIAGNOSIS — J309 Allergic rhinitis, unspecified: Secondary | ICD-10-CM

## 2020-11-05 ENCOUNTER — Ambulatory Visit (INDEPENDENT_AMBULATORY_CARE_PROVIDER_SITE_OTHER): Payer: Commercial Managed Care - PPO

## 2020-11-05 ENCOUNTER — Other Ambulatory Visit: Payer: Self-pay | Admitting: Obstetrics and Gynecology

## 2020-11-05 DIAGNOSIS — J309 Allergic rhinitis, unspecified: Secondary | ICD-10-CM

## 2020-11-05 DIAGNOSIS — Z1231 Encounter for screening mammogram for malignant neoplasm of breast: Secondary | ICD-10-CM

## 2020-11-12 ENCOUNTER — Ambulatory Visit (INDEPENDENT_AMBULATORY_CARE_PROVIDER_SITE_OTHER): Payer: Commercial Managed Care - PPO

## 2020-11-12 DIAGNOSIS — J309 Allergic rhinitis, unspecified: Secondary | ICD-10-CM

## 2020-11-19 ENCOUNTER — Ambulatory Visit (INDEPENDENT_AMBULATORY_CARE_PROVIDER_SITE_OTHER): Payer: Commercial Managed Care - PPO

## 2020-11-19 DIAGNOSIS — J309 Allergic rhinitis, unspecified: Secondary | ICD-10-CM

## 2020-11-28 ENCOUNTER — Ambulatory Visit (INDEPENDENT_AMBULATORY_CARE_PROVIDER_SITE_OTHER): Payer: Commercial Managed Care - PPO

## 2020-11-28 DIAGNOSIS — J309 Allergic rhinitis, unspecified: Secondary | ICD-10-CM | POA: Diagnosis not present

## 2020-12-05 ENCOUNTER — Ambulatory Visit (INDEPENDENT_AMBULATORY_CARE_PROVIDER_SITE_OTHER): Payer: Commercial Managed Care - PPO

## 2020-12-05 ENCOUNTER — Telehealth: Payer: Self-pay | Admitting: Allergy & Immunology

## 2020-12-05 DIAGNOSIS — J309 Allergic rhinitis, unspecified: Secondary | ICD-10-CM | POA: Diagnosis not present

## 2020-12-05 NOTE — Telephone Encounter (Signed)
Patient would like to speak to someone in billing.   Best contact number: [phone]

## 2020-12-09 ENCOUNTER — Other Ambulatory Visit: Payer: Self-pay

## 2020-12-09 ENCOUNTER — Ambulatory Visit
Admission: RE | Admit: 2020-12-09 | Discharge: 2020-12-09 | Disposition: A | Discharge status: Home / Self Care | Payer: Commercial Managed Care - PPO | Source: Ambulatory Visit | Attending: Obstetrics and Gynecology | Admitting: Obstetrics and Gynecology

## 2020-12-09 DIAGNOSIS — Z1231 Encounter for screening mammogram for malignant neoplasm of breast: Secondary | ICD-10-CM

## 2020-12-10 ENCOUNTER — Ambulatory Visit (INDEPENDENT_AMBULATORY_CARE_PROVIDER_SITE_OTHER): Payer: Commercial Managed Care - PPO

## 2020-12-10 DIAGNOSIS — J309 Allergic rhinitis, unspecified: Secondary | ICD-10-CM | POA: Diagnosis not present

## 2020-12-19 ENCOUNTER — Ambulatory Visit (INDEPENDENT_AMBULATORY_CARE_PROVIDER_SITE_OTHER): Payer: Commercial Managed Care - PPO

## 2020-12-19 DIAGNOSIS — J309 Allergic rhinitis, unspecified: Secondary | ICD-10-CM

## 2020-12-19 NOTE — Telephone Encounter (Signed)
Patient stopped by to see if the break down of her transactions had been placed in the mail on last week when she spoke to [place].  Please Advise

## 2020-12-22 NOTE — Telephone Encounter (Signed)
Patient was mailed a print out of all her transactions for the year to date. Mailed on 12/22/20.

## 2020-12-22 NOTE — Telephone Encounter (Signed)
Noted, thank you

## 2020-12-24 ENCOUNTER — Ambulatory Visit (INDEPENDENT_AMBULATORY_CARE_PROVIDER_SITE_OTHER): Payer: Commercial Managed Care - PPO

## 2020-12-24 DIAGNOSIS — J309 Allergic rhinitis, unspecified: Secondary | ICD-10-CM

## 2021-01-07 ENCOUNTER — Ambulatory Visit (INDEPENDENT_AMBULATORY_CARE_PROVIDER_SITE_OTHER): Payer: Commercial Managed Care - PPO | Admitting: *Deleted

## 2021-01-07 DIAGNOSIS — J309 Allergic rhinitis, unspecified: Secondary | ICD-10-CM | POA: Diagnosis not present

## 2021-01-16 ENCOUNTER — Ambulatory Visit (INDEPENDENT_AMBULATORY_CARE_PROVIDER_SITE_OTHER): Payer: Commercial Managed Care - PPO

## 2021-01-16 DIAGNOSIS — J309 Allergic rhinitis, unspecified: Secondary | ICD-10-CM

## 2021-01-30 ENCOUNTER — Ambulatory Visit (INDEPENDENT_AMBULATORY_CARE_PROVIDER_SITE_OTHER): Payer: Commercial Managed Care - PPO

## 2021-01-30 DIAGNOSIS — J309 Allergic rhinitis, unspecified: Secondary | ICD-10-CM

## 2021-02-11 ENCOUNTER — Ambulatory Visit (INDEPENDENT_AMBULATORY_CARE_PROVIDER_SITE_OTHER): Payer: Commercial Managed Care - PPO | Admitting: *Deleted

## 2021-02-11 DIAGNOSIS — J309 Allergic rhinitis, unspecified: Secondary | ICD-10-CM

## 2021-02-20 ENCOUNTER — Ambulatory Visit (INDEPENDENT_AMBULATORY_CARE_PROVIDER_SITE_OTHER): Payer: Commercial Managed Care - PPO

## 2021-02-20 DIAGNOSIS — J309 Allergic rhinitis, unspecified: Secondary | ICD-10-CM

## 2021-03-04 ENCOUNTER — Ambulatory Visit (INDEPENDENT_AMBULATORY_CARE_PROVIDER_SITE_OTHER): Payer: Commercial Managed Care - PPO

## 2021-03-04 DIAGNOSIS — J309 Allergic rhinitis, unspecified: Secondary | ICD-10-CM | POA: Diagnosis not present

## 2021-03-13 ENCOUNTER — Ambulatory Visit (INDEPENDENT_AMBULATORY_CARE_PROVIDER_SITE_OTHER): Payer: Commercial Managed Care - PPO

## 2021-03-13 DIAGNOSIS — J309 Allergic rhinitis, unspecified: Secondary | ICD-10-CM

## 2021-03-19 ENCOUNTER — Encounter: Payer: Self-pay | Admitting: Allergy & Immunology

## 2021-03-24 MED ORDER — EPINEPHRINE 0.3 MG/0.3ML IJ SOAJ: 0.3000 mg | Freq: Once | INTRAMUSCULAR | 2 refills | Status: AC

## 2021-03-25 ENCOUNTER — Ambulatory Visit (INDEPENDENT_AMBULATORY_CARE_PROVIDER_SITE_OTHER): Payer: Commercial Managed Care - PPO

## 2021-03-25 DIAGNOSIS — J309 Allergic rhinitis, unspecified: Secondary | ICD-10-CM

## 2021-03-25 MED ORDER — EPINEPHRINE 0.3 MG/0.3ML IJ SOAJ: 0.3000 mg | INTRAMUSCULAR | 0 refills | Status: DC | PRN

## 2021-03-31 ENCOUNTER — Encounter: Payer: Self-pay | Admitting: Allergy & Immunology

## 2021-04-08 ENCOUNTER — Ambulatory Visit (INDEPENDENT_AMBULATORY_CARE_PROVIDER_SITE_OTHER): Payer: Commercial Managed Care - PPO

## 2021-04-08 DIAGNOSIS — J309 Allergic rhinitis, unspecified: Secondary | ICD-10-CM

## 2021-04-15 ENCOUNTER — Ambulatory Visit (INDEPENDENT_AMBULATORY_CARE_PROVIDER_SITE_OTHER): Payer: Commercial Managed Care - PPO

## 2021-04-15 DIAGNOSIS — J309 Allergic rhinitis, unspecified: Secondary | ICD-10-CM | POA: Diagnosis not present

## 2021-04-22 ENCOUNTER — Ambulatory Visit (INDEPENDENT_AMBULATORY_CARE_PROVIDER_SITE_OTHER): Payer: Commercial Managed Care - PPO

## 2021-04-22 DIAGNOSIS — J309 Allergic rhinitis, unspecified: Secondary | ICD-10-CM | POA: Diagnosis not present

## 2021-04-29 ENCOUNTER — Ambulatory Visit (INDEPENDENT_AMBULATORY_CARE_PROVIDER_SITE_OTHER): Payer: Commercial Managed Care - PPO | Admitting: *Deleted

## 2021-04-29 DIAGNOSIS — J309 Allergic rhinitis, unspecified: Secondary | ICD-10-CM

## 2021-05-08 ENCOUNTER — Ambulatory Visit (INDEPENDENT_AMBULATORY_CARE_PROVIDER_SITE_OTHER): Payer: Commercial Managed Care - PPO

## 2021-05-08 DIAGNOSIS — J309 Allergic rhinitis, unspecified: Secondary | ICD-10-CM

## 2021-05-15 ENCOUNTER — Ambulatory Visit (INDEPENDENT_AMBULATORY_CARE_PROVIDER_SITE_OTHER): Payer: Commercial Managed Care - PPO

## 2021-05-15 DIAGNOSIS — J309 Allergic rhinitis, unspecified: Secondary | ICD-10-CM

## 2021-05-27 ENCOUNTER — Ambulatory Visit (INDEPENDENT_AMBULATORY_CARE_PROVIDER_SITE_OTHER): Payer: Commercial Managed Care - PPO

## 2021-05-27 DIAGNOSIS — J309 Allergic rhinitis, unspecified: Secondary | ICD-10-CM | POA: Diagnosis not present

## 2021-06-03 ENCOUNTER — Ambulatory Visit (INDEPENDENT_AMBULATORY_CARE_PROVIDER_SITE_OTHER): Payer: Commercial Managed Care - PPO

## 2021-06-03 DIAGNOSIS — J309 Allergic rhinitis, unspecified: Secondary | ICD-10-CM

## 2021-06-12 ENCOUNTER — Ambulatory Visit (INDEPENDENT_AMBULATORY_CARE_PROVIDER_SITE_OTHER): Payer: Commercial Managed Care - PPO

## 2021-06-12 DIAGNOSIS — J309 Allergic rhinitis, unspecified: Secondary | ICD-10-CM

## 2021-06-19 ENCOUNTER — Ambulatory Visit (INDEPENDENT_AMBULATORY_CARE_PROVIDER_SITE_OTHER): Payer: Commercial Managed Care - PPO

## 2021-06-19 DIAGNOSIS — J309 Allergic rhinitis, unspecified: Secondary | ICD-10-CM

## 2021-07-01 ENCOUNTER — Ambulatory Visit (INDEPENDENT_AMBULATORY_CARE_PROVIDER_SITE_OTHER): Payer: Commercial Managed Care - PPO

## 2021-07-01 DIAGNOSIS — J309 Allergic rhinitis, unspecified: Secondary | ICD-10-CM

## 2021-07-02 DIAGNOSIS — J3089 Other allergic rhinitis: Secondary | ICD-10-CM | POA: Diagnosis not present

## 2021-07-02 NOTE — Progress Notes (Signed)
VIAL EXP 07-03-22

## 2021-07-10 ENCOUNTER — Ambulatory Visit (INDEPENDENT_AMBULATORY_CARE_PROVIDER_SITE_OTHER): Payer: Commercial Managed Care - PPO

## 2021-07-10 DIAGNOSIS — J309 Allergic rhinitis, unspecified: Secondary | ICD-10-CM

## 2021-07-15 ENCOUNTER — Ambulatory Visit (INDEPENDENT_AMBULATORY_CARE_PROVIDER_SITE_OTHER): Payer: Commercial Managed Care - PPO

## 2021-07-15 DIAGNOSIS — J309 Allergic rhinitis, unspecified: Secondary | ICD-10-CM | POA: Diagnosis not present

## 2021-07-22 ENCOUNTER — Ambulatory Visit (INDEPENDENT_AMBULATORY_CARE_PROVIDER_SITE_OTHER): Payer: Commercial Managed Care - PPO

## 2021-07-22 DIAGNOSIS — J309 Allergic rhinitis, unspecified: Secondary | ICD-10-CM | POA: Diagnosis not present

## 2021-07-31 ENCOUNTER — Ambulatory Visit (INDEPENDENT_AMBULATORY_CARE_PROVIDER_SITE_OTHER): Payer: Commercial Managed Care - PPO

## 2021-07-31 DIAGNOSIS — J309 Allergic rhinitis, unspecified: Secondary | ICD-10-CM

## 2021-08-05 ENCOUNTER — Ambulatory Visit (INDEPENDENT_AMBULATORY_CARE_PROVIDER_SITE_OTHER): Payer: Commercial Managed Care - PPO

## 2021-08-05 DIAGNOSIS — J309 Allergic rhinitis, unspecified: Secondary | ICD-10-CM

## 2021-08-14 ENCOUNTER — Ambulatory Visit (INDEPENDENT_AMBULATORY_CARE_PROVIDER_SITE_OTHER): Payer: Commercial Managed Care - PPO | Admitting: Family Medicine

## 2021-08-14 ENCOUNTER — Encounter: Payer: Self-pay | Admitting: Family Medicine

## 2021-08-14 VITALS — BP 150/86 | HR 76 | Temp 98.9°F | Resp 18 | Ht 65.0 in | Wt 123.5 lb

## 2021-08-14 DIAGNOSIS — J309 Allergic rhinitis, unspecified: Secondary | ICD-10-CM

## 2021-08-14 DIAGNOSIS — K219 Gastro-esophageal reflux disease without esophagitis: Secondary | ICD-10-CM | POA: Diagnosis not present

## 2021-08-14 DIAGNOSIS — L239 Allergic contact dermatitis, unspecified cause: Secondary | ICD-10-CM | POA: Diagnosis not present

## 2021-08-14 DIAGNOSIS — J3089 Other allergic rhinitis: Secondary | ICD-10-CM | POA: Insufficient documentation

## 2021-08-14 DIAGNOSIS — H1010 Acute atopic conjunctivitis, unspecified eye: Secondary | ICD-10-CM | POA: Insufficient documentation

## 2021-08-14 DIAGNOSIS — H1013 Acute atopic conjunctivitis, bilateral: Secondary | ICD-10-CM | POA: Diagnosis not present

## 2021-08-14 DIAGNOSIS — R040 Epistaxis: Secondary | ICD-10-CM | POA: Insufficient documentation

## 2021-08-14 MED ORDER — EUCRISA 2 % EX OINT: 1.0000 "application " | TOPICAL_OINTMENT | Freq: Two times a day (BID) | CUTANEOUS | 5 refills | Status: DC

## 2021-08-14 NOTE — Progress Notes (Signed)
[place], [place] [address] Dept: [phone]  FOLLOW UP NOTE  Patient ID: [name], female    DOB: 1960-12-20  Age: 61 y.o. MRN: [id number] Date of Office Visit: 08/14/2021  Assessment  Chief Complaint: Allergic Rhinitis  (Allergies are terrible. Has been having a rough year. It was worse in Feb. A lot of sinus pressure, drainage, having to clear the throat frequently. Can't increase on shots because she has been reacting locally. Try's to use saline sprays every morning. )  HPI GAILA ENGEBRETSEN is a 61 year old female who presents to the clinic for follow-up visit.  Her last visit to this clinic was on 07/23/2020 with Althea Charon, FNP, for evaluation of allergic rhinitis, atopic dermatitis, and epistaxis.  At today's visit, she reports her allergic rhinitis has been poorly controlled with symptoms including occasional nasal congestion that moves from side to side, occasional sinus headache, and frequent thick postnasal drainage with frequent throat clearing.  She continues Allegra 180 mg on injection day and occasionally the day after injection and uses saline nasal rinses occasionally.  She does report that she frequently experiences dry nostrils.  She continues allergen immunotherapy with frequent large and local reactions.  She reports a decrease in her symptoms of allergic rhinitis while continuing on allergen immunotherapy.  She has not had a sinus infection or sore throat since her last visit to this clinic.  She has not experienced epistaxis since her last visit to this clinic.  She does avoid steroid nasal sprays.  Allergic conjunctivitis is reported as moderately well controlled with symptoms including red and itchy eyes.  She does wear contact lenses.  She continues to use a lubricating eyedrop and occasionally uses an allergy eyedrop.  Atopic dermatitis is reported as moderately well controlled with Eucrisa as needed.  Her current medications are  listed in the chart.   Drug Allergies:  Allergies  Allergen Reactions   Sulfonamide Derivatives    Sulfa Antibiotics Rash    Physical Exam: BP (!) 150/86   Pulse 76   Temp 98.9 F (37.2 C)   Resp 18   Ht '5\' 5"'$  (1.651 m)   Wt 123 lb 8 oz (56 kg)   LMP 02/11/2012   SpO2 100%   BMI 20.55 kg/m    Physical Exam Vitals reviewed.  Constitutional:      Appearance: Normal appearance.  HENT:     Head: Normocephalic and atraumatic.     Right Ear: Tympanic membrane normal.     Left Ear: Tympanic membrane normal.     Nose:     Comments: Bilateral nares slightly erythematous with clear nasal drainage noted.  Pharynx normal.  Ears normal.  Eyes normal.    Mouth/Throat:     Pharynx: Oropharynx is clear.  Eyes:     Conjunctiva/sclera: Conjunctivae normal.  Cardiovascular:     Rate and Rhythm: Normal rate and regular rhythm.     Heart sounds: Normal heart sounds. No murmur heard. Pulmonary:     Effort: Pulmonary effort is normal.     Breath sounds: Normal breath sounds.     Comments: Lungs clear to auscultation Musculoskeletal:        General: Normal range of motion.     Cervical back: Normal range of motion and neck supple.  Skin:    General: Skin is warm and dry.  Neurological:     Mental Status: She is alert and oriented to person, place, and time.  Psychiatric:  Mood and Affect: Mood normal.        Behavior: Behavior normal.        Thought Content: Thought content normal.        Judgment: Judgment normal.     Assessment and Plan: 1. Seasonal and perennial allergic rhinitis   2. Allergic contact dermatitis, unspecified trigger   3. Seasonal allergic conjunctivitis   4. Gastroesophageal reflux disease, unspecified whether esophagitis present   5. Epistaxis     No orders of the defined types were placed in this encounter.   Patient Instructions  Allergic rhinitis Continue allergen avoidance measures directed toward pollens, cat, and dust mite as listed  below Continue allergen immunotherapy per protocol and have access to an epinephrine autoinjector set.  Lets consider moving forward with your allergen immunotherapy dose after ragweed pollens decrease Continue an antihistamine once a day as needed for runny nose or itch.  Take this medication on injection day. Remember to rotate to a different antihistamine about every 3 months. Some examples of over the counter antihistamines include Zyrtec (cetirizine), Xyzal (levocetirizine), Allegra (fexofenadine), and Claritin (loratidine).  Begin saline nasal rinses as needed for nasal symptoms. Use this before any medicated nasal sprays for best result Begin nasal saline gel as needed for dry nostrils  Allergic conjunctivitis Continue a lubricating eyedrop.  Use this before allergy eyedrops for best results.  Dermatitis Continue to avoid Carba mix, p-Phenylene-diamine, and Gold sodium thiosulfate Continue Eucrisa to red and itchy areas up to twice a day as needed.  Reflux Begin dietary and lifestyle modifications as listed below  Epistaxis Pinch both nostrils while leaning forward for at least 5 minutes before checking to see if the bleeding has stopped. If bleeding is not controlled within 5-10 minutes apply a cotton ball soaked with oxymetazoline (Afrin) to the bleeding nostril for a few seconds.  If the problem persists or worsens a referral to ENT for further evaluation may be necessary.  Call the clinic if this treatment plan is not working well for you  Follow up in 1 year or sooner if needed.   Return in about 1 year (around 08/15/2022), or if symptoms worsen or fail to improve.    Thank you for the opportunity to care for this patient.  Please do not hesitate to contact me with questions.  Gareth Morgan, FNP All[facility] and [facility] [facility] [place]

## 2021-08-14 NOTE — Addendum Note (Signed)
Addended by: Herbie Drape on: 08/14/2021 02:28 PM   Modules accepted: Orders

## 2021-08-14 NOTE — Patient Instructions (Addendum)
Allergic rhinitis Continue allergen avoidance measures directed toward pollens, cat, and dust mite as listed below Continue allergen immunotherapy per protocol and have access to an epinephrine autoinjector set.  Lets consider moving forward with your allergen immunotherapy dose after ragweed pollens decrease Continue an antihistamine once a day as needed for runny nose or itch.  Take this medication on injection day. Remember to rotate to a different antihistamine about every 3 months. Some examples of over the counter antihistamines include Zyrtec (cetirizine), Xyzal (levocetirizine), Allegra (fexofenadine), and Claritin (loratidine).  Begin saline nasal rinses as needed for nasal symptoms. Use this before any medicated nasal sprays for best result Begin nasal saline gel as needed for dry nostrils  Allergic conjunctivitis Continue a lubricating eyedrop.  Use this before allergy eyedrops for best results.  Dermatitis Continue to avoid Carba mix, p-Phenylene-diamine, and Gold sodium thiosulfate Continue Eucrisa to red and itchy areas up to twice a day as needed.  Reflux Begin dietary and lifestyle modifications as listed below  Epistaxis Pinch both nostrils while leaning forward for at least 5 minutes before checking to see if the bleeding has stopped. If bleeding is not controlled within 5-10 minutes apply a cotton ball soaked with oxymetazoline (Afrin) to the bleeding nostril for a few seconds.  If the problem persists or worsens a referral to ENT for further evaluation may be necessary.  Call the clinic if this treatment plan is not working well for you  Follow up in 1 year or sooner if needed.   Lifestyle Changes for Controlling GERD When you have GERD, stomach acid feels as if it's backing up toward your mouth. Whether or not you take medication to control your GERD, your symptoms can often be improved with lifestyle changes.   Raise Your Head Reflux is more likely to strike when  you're lying down flat, because stomach fluid can flow backward more easily. Raising the head of your bed 4-6 inches can help. To do this: Slide blocks or books under the legs at the head of your bed. Or, place a wedge under the mattress. Many foam stores can make a suitable wedge for you. The wedge should run from your waist to the top of your head. Don't just prop your head on several pillows. This increases pressure on your stomach. It can make GERD worse.  Watch Your Eating Habits Certain foods may increase the acid in your stomach or relax the lower esophageal sphincter, making GERD more likely. It's best to avoid the following: Coffee, tea, and carbonated drinks (with and without caffeine) Fatty, fried, or spicy food Mint, chocolate, onions, and tomatoes Any other foods that seem to irritate your stomach or cause you pain  Relieve the Pressure Eat smaller meals, even if you have to eat more often. Don't lie down right after you eat. Wait a few hours for your stomach to empty. Avoid tight belts and tight-fitting clothes. Lose excess weight.  Tobacco and Alcohol Avoid smoking tobacco and drinking alcohol. They can make GERD symptoms worse.

## 2021-08-21 ENCOUNTER — Ambulatory Visit (INDEPENDENT_AMBULATORY_CARE_PROVIDER_SITE_OTHER): Payer: Commercial Managed Care - PPO

## 2021-08-21 DIAGNOSIS — J309 Allergic rhinitis, unspecified: Secondary | ICD-10-CM

## 2021-08-26 ENCOUNTER — Ambulatory Visit: Payer: Commercial Managed Care - PPO | Admitting: Family

## 2021-08-26 ENCOUNTER — Ambulatory Visit (INDEPENDENT_AMBULATORY_CARE_PROVIDER_SITE_OTHER): Payer: Commercial Managed Care - PPO

## 2021-08-26 DIAGNOSIS — J309 Allergic rhinitis, unspecified: Secondary | ICD-10-CM | POA: Diagnosis not present

## 2021-09-04 ENCOUNTER — Ambulatory Visit (INDEPENDENT_AMBULATORY_CARE_PROVIDER_SITE_OTHER): Payer: Commercial Managed Care - PPO

## 2021-09-04 DIAGNOSIS — J309 Allergic rhinitis, unspecified: Secondary | ICD-10-CM | POA: Diagnosis not present

## 2021-09-11 ENCOUNTER — Ambulatory Visit (INDEPENDENT_AMBULATORY_CARE_PROVIDER_SITE_OTHER): Payer: Commercial Managed Care - PPO

## 2021-09-11 DIAGNOSIS — J309 Allergic rhinitis, unspecified: Secondary | ICD-10-CM | POA: Diagnosis not present

## 2021-09-18 ENCOUNTER — Ambulatory Visit (INDEPENDENT_AMBULATORY_CARE_PROVIDER_SITE_OTHER): Payer: Commercial Managed Care - PPO

## 2021-09-18 DIAGNOSIS — J309 Allergic rhinitis, unspecified: Secondary | ICD-10-CM

## 2021-09-25 ENCOUNTER — Ambulatory Visit (INDEPENDENT_AMBULATORY_CARE_PROVIDER_SITE_OTHER): Payer: Commercial Managed Care - PPO

## 2021-09-25 DIAGNOSIS — J309 Allergic rhinitis, unspecified: Secondary | ICD-10-CM | POA: Diagnosis not present

## 2021-10-02 ENCOUNTER — Ambulatory Visit (INDEPENDENT_AMBULATORY_CARE_PROVIDER_SITE_OTHER): Payer: Commercial Managed Care - PPO | Admitting: *Deleted

## 2021-10-02 DIAGNOSIS — J309 Allergic rhinitis, unspecified: Secondary | ICD-10-CM | POA: Diagnosis not present

## 2021-10-09 ENCOUNTER — Ambulatory Visit (INDEPENDENT_AMBULATORY_CARE_PROVIDER_SITE_OTHER): Payer: Commercial Managed Care - PPO

## 2021-10-09 DIAGNOSIS — J309 Allergic rhinitis, unspecified: Secondary | ICD-10-CM | POA: Diagnosis not present

## 2021-10-16 ENCOUNTER — Ambulatory Visit (INDEPENDENT_AMBULATORY_CARE_PROVIDER_SITE_OTHER): Payer: Commercial Managed Care - PPO

## 2021-10-16 DIAGNOSIS — J309 Allergic rhinitis, unspecified: Secondary | ICD-10-CM | POA: Diagnosis not present

## 2021-10-23 ENCOUNTER — Ambulatory Visit (INDEPENDENT_AMBULATORY_CARE_PROVIDER_SITE_OTHER): Payer: Commercial Managed Care - PPO

## 2021-10-23 DIAGNOSIS — J309 Allergic rhinitis, unspecified: Secondary | ICD-10-CM

## 2021-11-04 ENCOUNTER — Ambulatory Visit (INDEPENDENT_AMBULATORY_CARE_PROVIDER_SITE_OTHER): Payer: Commercial Managed Care - PPO

## 2021-11-04 DIAGNOSIS — J309 Allergic rhinitis, unspecified: Secondary | ICD-10-CM | POA: Diagnosis not present

## 2021-11-13 ENCOUNTER — Ambulatory Visit (INDEPENDENT_AMBULATORY_CARE_PROVIDER_SITE_OTHER): Payer: Commercial Managed Care - PPO

## 2021-11-13 DIAGNOSIS — J309 Allergic rhinitis, unspecified: Secondary | ICD-10-CM

## 2021-11-20 ENCOUNTER — Ambulatory Visit (INDEPENDENT_AMBULATORY_CARE_PROVIDER_SITE_OTHER): Payer: Commercial Managed Care - PPO

## 2021-11-20 DIAGNOSIS — J309 Allergic rhinitis, unspecified: Secondary | ICD-10-CM | POA: Diagnosis not present

## 2021-11-25 ENCOUNTER — Other Ambulatory Visit: Payer: Self-pay | Admitting: Obstetrics and Gynecology

## 2021-11-25 DIAGNOSIS — Z1231 Encounter for screening mammogram for malignant neoplasm of breast: Secondary | ICD-10-CM

## 2021-11-27 ENCOUNTER — Ambulatory Visit (INDEPENDENT_AMBULATORY_CARE_PROVIDER_SITE_OTHER): Payer: Commercial Managed Care - PPO

## 2021-11-27 DIAGNOSIS — J309 Allergic rhinitis, unspecified: Secondary | ICD-10-CM

## 2021-12-04 ENCOUNTER — Ambulatory Visit (INDEPENDENT_AMBULATORY_CARE_PROVIDER_SITE_OTHER): Payer: Commercial Managed Care - PPO

## 2021-12-04 DIAGNOSIS — J309 Allergic rhinitis, unspecified: Secondary | ICD-10-CM

## 2021-12-11 ENCOUNTER — Ambulatory Visit (INDEPENDENT_AMBULATORY_CARE_PROVIDER_SITE_OTHER): Payer: Commercial Managed Care - PPO

## 2021-12-11 DIAGNOSIS — J309 Allergic rhinitis, unspecified: Secondary | ICD-10-CM

## 2021-12-18 ENCOUNTER — Ambulatory Visit (INDEPENDENT_AMBULATORY_CARE_PROVIDER_SITE_OTHER): Payer: Commercial Managed Care - PPO

## 2021-12-18 DIAGNOSIS — J309 Allergic rhinitis, unspecified: Secondary | ICD-10-CM

## 2021-12-31 ENCOUNTER — Ambulatory Visit
Admission: RE | Admit: 2021-12-31 | Discharge: 2021-12-31 | Disposition: A | Discharge status: Home / Self Care | Payer: Commercial Managed Care - PPO | Source: Ambulatory Visit | Attending: Obstetrics and Gynecology | Admitting: Obstetrics and Gynecology

## 2021-12-31 DIAGNOSIS — Z1231 Encounter for screening mammogram for malignant neoplasm of breast: Secondary | ICD-10-CM

## 2022-01-01 ENCOUNTER — Ambulatory Visit (INDEPENDENT_AMBULATORY_CARE_PROVIDER_SITE_OTHER): Payer: Commercial Managed Care - PPO | Admitting: *Deleted

## 2022-01-01 DIAGNOSIS — J309 Allergic rhinitis, unspecified: Secondary | ICD-10-CM

## 2022-01-15 ENCOUNTER — Ambulatory Visit (INDEPENDENT_AMBULATORY_CARE_PROVIDER_SITE_OTHER): Payer: Commercial Managed Care - PPO

## 2022-01-15 DIAGNOSIS — J309 Allergic rhinitis, unspecified: Secondary | ICD-10-CM | POA: Diagnosis not present

## 2022-01-29 ENCOUNTER — Ambulatory Visit (INDEPENDENT_AMBULATORY_CARE_PROVIDER_SITE_OTHER): Payer: Commercial Managed Care - PPO

## 2022-01-29 DIAGNOSIS — J309 Allergic rhinitis, unspecified: Secondary | ICD-10-CM

## 2022-02-03 ENCOUNTER — Ambulatory Visit (INDEPENDENT_AMBULATORY_CARE_PROVIDER_SITE_OTHER): Payer: Commercial Managed Care - PPO

## 2022-02-03 DIAGNOSIS — J309 Allergic rhinitis, unspecified: Secondary | ICD-10-CM

## 2022-02-12 ENCOUNTER — Ambulatory Visit (INDEPENDENT_AMBULATORY_CARE_PROVIDER_SITE_OTHER): Payer: Commercial Managed Care - PPO

## 2022-02-12 DIAGNOSIS — J309 Allergic rhinitis, unspecified: Secondary | ICD-10-CM | POA: Diagnosis not present

## 2022-02-19 ENCOUNTER — Ambulatory Visit (INDEPENDENT_AMBULATORY_CARE_PROVIDER_SITE_OTHER): Payer: Commercial Managed Care - PPO

## 2022-02-19 DIAGNOSIS — J309 Allergic rhinitis, unspecified: Secondary | ICD-10-CM | POA: Diagnosis not present

## 2022-03-05 ENCOUNTER — Ambulatory Visit (INDEPENDENT_AMBULATORY_CARE_PROVIDER_SITE_OTHER): Payer: Commercial Managed Care - PPO

## 2022-03-05 DIAGNOSIS — J309 Allergic rhinitis, unspecified: Secondary | ICD-10-CM | POA: Diagnosis not present

## 2022-03-12 ENCOUNTER — Ambulatory Visit (INDEPENDENT_AMBULATORY_CARE_PROVIDER_SITE_OTHER): Payer: Commercial Managed Care - PPO

## 2022-03-12 DIAGNOSIS — J309 Allergic rhinitis, unspecified: Secondary | ICD-10-CM | POA: Diagnosis not present

## 2022-03-19 ENCOUNTER — Ambulatory Visit (INDEPENDENT_AMBULATORY_CARE_PROVIDER_SITE_OTHER): Payer: Commercial Managed Care - PPO

## 2022-03-19 DIAGNOSIS — J309 Allergic rhinitis, unspecified: Secondary | ICD-10-CM | POA: Diagnosis not present

## 2022-03-26 ENCOUNTER — Ambulatory Visit (INDEPENDENT_AMBULATORY_CARE_PROVIDER_SITE_OTHER): Payer: Commercial Managed Care - PPO

## 2022-03-26 DIAGNOSIS — J309 Allergic rhinitis, unspecified: Secondary | ICD-10-CM | POA: Diagnosis not present

## 2022-04-02 ENCOUNTER — Ambulatory Visit (INDEPENDENT_AMBULATORY_CARE_PROVIDER_SITE_OTHER): Payer: Commercial Managed Care - PPO

## 2022-04-02 DIAGNOSIS — J309 Allergic rhinitis, unspecified: Secondary | ICD-10-CM

## 2022-04-16 ENCOUNTER — Ambulatory Visit (INDEPENDENT_AMBULATORY_CARE_PROVIDER_SITE_OTHER): Payer: Commercial Managed Care - PPO

## 2022-04-16 DIAGNOSIS — J309 Allergic rhinitis, unspecified: Secondary | ICD-10-CM | POA: Diagnosis not present

## 2022-04-30 ENCOUNTER — Ambulatory Visit (INDEPENDENT_AMBULATORY_CARE_PROVIDER_SITE_OTHER): Payer: Commercial Managed Care - PPO | Admitting: *Deleted

## 2022-04-30 DIAGNOSIS — J309 Allergic rhinitis, unspecified: Secondary | ICD-10-CM

## 2022-05-07 ENCOUNTER — Ambulatory Visit (INDEPENDENT_AMBULATORY_CARE_PROVIDER_SITE_OTHER): Payer: Commercial Managed Care - PPO

## 2022-05-07 DIAGNOSIS — J309 Allergic rhinitis, unspecified: Secondary | ICD-10-CM

## 2022-05-10 DIAGNOSIS — J3089 Other allergic rhinitis: Secondary | ICD-10-CM | POA: Diagnosis not present

## 2022-05-21 ENCOUNTER — Ambulatory Visit (INDEPENDENT_AMBULATORY_CARE_PROVIDER_SITE_OTHER): Payer: Commercial Managed Care - PPO

## 2022-05-21 DIAGNOSIS — J309 Allergic rhinitis, unspecified: Secondary | ICD-10-CM | POA: Diagnosis not present

## 2022-06-02 ENCOUNTER — Ambulatory Visit (INDEPENDENT_AMBULATORY_CARE_PROVIDER_SITE_OTHER): Payer: Commercial Managed Care - PPO

## 2022-06-02 DIAGNOSIS — J309 Allergic rhinitis, unspecified: Secondary | ICD-10-CM

## 2022-06-11 ENCOUNTER — Ambulatory Visit (INDEPENDENT_AMBULATORY_CARE_PROVIDER_SITE_OTHER): Payer: Commercial Managed Care - PPO

## 2022-06-11 DIAGNOSIS — J309 Allergic rhinitis, unspecified: Secondary | ICD-10-CM

## 2022-06-18 ENCOUNTER — Ambulatory Visit (INDEPENDENT_AMBULATORY_CARE_PROVIDER_SITE_OTHER): Payer: Commercial Managed Care - PPO

## 2022-06-18 DIAGNOSIS — J309 Allergic rhinitis, unspecified: Secondary | ICD-10-CM

## 2022-06-30 ENCOUNTER — Ambulatory Visit (INDEPENDENT_AMBULATORY_CARE_PROVIDER_SITE_OTHER): Payer: Commercial Managed Care - PPO

## 2022-06-30 DIAGNOSIS — J309 Allergic rhinitis, unspecified: Secondary | ICD-10-CM | POA: Diagnosis not present

## 2022-07-09 ENCOUNTER — Ambulatory Visit (INDEPENDENT_AMBULATORY_CARE_PROVIDER_SITE_OTHER): Payer: Commercial Managed Care - PPO

## 2022-07-09 DIAGNOSIS — J309 Allergic rhinitis, unspecified: Secondary | ICD-10-CM

## 2022-07-21 ENCOUNTER — Ambulatory Visit (INDEPENDENT_AMBULATORY_CARE_PROVIDER_SITE_OTHER): Payer: Commercial Managed Care - PPO | Admitting: Allergy & Immunology

## 2022-07-21 ENCOUNTER — Other Ambulatory Visit: Payer: Self-pay

## 2022-07-21 ENCOUNTER — Encounter: Payer: Self-pay | Admitting: Allergy & Immunology

## 2022-07-21 VITALS — BP 116/70 | HR 86 | Temp 97.3°F | Resp 18 | Ht 65.0 in | Wt 126.4 lb

## 2022-07-21 DIAGNOSIS — J309 Allergic rhinitis, unspecified: Secondary | ICD-10-CM | POA: Diagnosis not present

## 2022-07-21 DIAGNOSIS — L239 Allergic contact dermatitis, unspecified cause: Secondary | ICD-10-CM | POA: Diagnosis not present

## 2022-07-21 DIAGNOSIS — J302 Other seasonal allergic rhinitis: Secondary | ICD-10-CM

## 2022-07-21 MED ORDER — FEXOFENADINE HCL 180 MG PO TABS: 180.0000 mg | ORAL_TABLET | Freq: Every day | ORAL | 5 refills | Status: DC | PRN

## 2022-07-21 NOTE — Progress Notes (Signed)
FOLLOW UP  Date of Service/Encounter:  07/21/22   Assessment:   Seasonal and perennial allergic rhinitis (grasses, ragweed, weeds, trees, indoor molds, dust mites and cat) - on allergen immunotherapy  Contact dermatitis (carba mix, p-Phenylene-diamine, gold sodium thiosulfate)    Fully vaccinated to COVID19 [employer])   We are going to go ahead and advance her allergy shots. Once she successfully reaches 0.5 mL of her Red Vial, we can talk about increasing her dust mite allergen in her vial to provide more protection. She is going to get dust mite coverings for her bed and pillows, which hopefully should help.     Plan/Recommendations:   1. Seasonal and perennial allergic rhinitis (grasses, ragweed, weeds, trees, indoor molds, dust mites and cat) - We will unfreeze you and plan to repeat each increased dose for three times before increasing to the next level. - Continue with: Allegra as needed.  - Continue with: nasal saline rinses 1-2 times daily as needed  2. Contact dermatitis (carba mix, p-Phenylene-diamine, gold sodium thiosulfate)  - Continue with Eucrisa as needed.   3. Return in about 6 months (around 01/20/2023).    Subjective:   Kayla [name] is a 62 y.o. female presenting today for follow up of  Chief Complaint  Patient presents with   Allergic Rhinitis     Pollen bloomed early and caused her not to feel good. Constant reaction this year.     Kayla [name] has a history of the following: Patient Active Problem List   Diagnosis Date Noted   Seasonal and perennial allergic rhinitis 08/14/2021   Seasonal allergic conjunctivitis 08/14/2021   Gastroesophageal reflux disease 08/14/2021   Epistaxis 08/14/2021   Allergic contact dermatitis 08/04/2020   Menopausal and female climacteric states 12/12/2017   Special screening for malignant neoplasms, colon 06/10/2016   ANXIETY 08/25/2007   ALLERGIC RHINITIS, SEASONAL 08/25/2007   IBS 08/25/2007     History obtained from: chart review and patient.  Kayla [name] is a 62 y.o. female presenting for a follow up visit. She was last seen in June 2023 by Thermon Leyland, one of our NPs. At that time, we continued with the use of allergen avoidance measures. We also continued with an antihistamine and recommended rotating every 3 months or so. She remained on her allergy shots, which we eventually froze due to her recurrent symptoms. Reflux was under control with avoidance of triggers. Contact dermatitis was under good control with avoidance of her triggers to which she was sensitized.   Since the last visit, she has done somewhat well. She is open to increasing her dosing again. We have been frozen at 0.15 mL for months at this point due to large loca reactions despite epinephrine rinses.   Allergic Rhinitis Symptom History: She reports that the shots are doing well. She was reacting, so we froze her at 0.15 mL. She would like to go ahead and start her advance. We froze her due to a history of large local reactions. She has been doing fairly well as far as her symptoms go, but she is still having a lot of congestion and postnasal drip that is worse in the mornings. She does better during the day, although she thinks that there is something in her workplace that makes her symptoms worse.       Kayla [name] is on allergen immunotherapy. She receives one injection. Immunotherapy script #1 contains trees, weeds, grasses, dust mites, and cat. She currently receives 0.51mL of the RED vial (1/100).  She started shots November of 2021 and reached maintenance in May of 2022. She has been frozen at 0.15 mL of her Red Vial for maintenance for over a year.  Work is going well. She is not sure how much longer she is going to work. Apparently there are a lot of people retiring and they are having trouble finding people to take their places.   Otherwise, there have been no changes to her past medical history, surgical history,  family history, or social history.    Review of Systems  Constitutional:  Negative for chills, fever and weight loss.  HENT:  Positive for congestion.        Reports nasal dryness, nasal congestion at times, and a lot of postnasal drip in the morning.  She denies any rhinorrhea.  She has not had any sinus infections since we last saw her.  Eyes:        Reports occasional itchy watery eyes.  She does have eyedrops that help with this.  She also reports that she has dry eyes for which she uses a wetting agent  Respiratory:  Negative for cough, shortness of breath and wheezing.   Cardiovascular:  Negative for chest pain and palpitations.  Gastrointestinal:  Negative for heartburn.  Genitourinary:  Negative for dysuria.  Skin:  Negative for itching and rash.  Neurological:  Negative for headaches.  Endo/Heme/Allergies:  Positive for environmental allergies.       Objective:   Blood pressure 116/70, pulse 86, temperature (!) 97.3 F (36.3 C), resp. rate 18, height 5\' 5"  (1.651 m), weight 126 lb 6.4 oz (57.3 kg), last menstrual period 02/11/2012, SpO2 96 %. Body mass index is 21.03 kg/m.    Physical Exam Vitals reviewed.  Constitutional:      Appearance: She is well-developed.     Comments: Friendly. Talkative.  HENT:     Head: Normocephalic and atraumatic.     Right Ear: Tympanic membrane, ear canal and external ear normal.     Left Ear: Tympanic membrane, ear canal and external ear normal.     Nose: No nasal deformity, septal deviation, mucosal edema or rhinorrhea.     Right Turbinates: Enlarged, swollen and pale.     Left Turbinates: Enlarged, swollen and pale.     Right Sinus: No maxillary sinus tenderness or frontal sinus tenderness.     Left Sinus: No maxillary sinus tenderness or frontal sinus tenderness.     Comments: There is postnasal drip present. She does have moderate cobblestoning in the posterior oropharynx. No nasal polyps noted.     Mouth/Throat:     Lips:  Pink.     Mouth: Mucous membranes are moist. Mucous membranes are not pale and not dry.     Pharynx: Uvula midline.     Comments: Cobblestoning present in the posterior oropharynx. Eyes:     General:        Right eye: No discharge.        Left eye: No discharge.     Conjunctiva/sclera: Conjunctivae normal.     Right eye: Right conjunctiva is not injected. No chemosis.    Left eye: Left conjunctiva is not injected. No chemosis.    Pupils: Pupils are equal, round, and reactive to light.  Cardiovascular:     Rate and Rhythm: Normal rate and regular rhythm.     Heart sounds: Normal heart sounds.  Pulmonary:     Effort: Pulmonary effort is normal. No tachypnea, accessory muscle usage or respiratory distress.  Breath sounds: Normal breath sounds. No wheezing, rhonchi or rales.     Comments: Moving air well in all lung fields. No increased work of breathing noted.  Chest:     Chest wall: No tenderness.  Lymphadenopathy:     Cervical: No cervical adenopathy.  Skin:    Coloration: Skin is not pale.     Findings: No abrasion, erythema, petechiae or rash. Rash is not papular, urticarial or vesicular.     Comments: No eczematous or urticarial lesions noted.   Neurological:     Mental Status: She is alert.  Psychiatric:        Behavior: Behavior is cooperative.      Diagnostic studies: none       Malachi Bonds, MD  [facility]

## 2022-07-21 NOTE — Patient Instructions (Addendum)
1. Seasonal and perennial allergic rhinitis (grasses, ragweed, weeds, trees, indoor molds, dust mites and cat) - We will unfreeze you and plan to repeat each increased dose for three times before increasing to the next level. - Continue with: Allegra as needed.  - Continue with: nasal saline rinses 1-2 times daily as needed 2. Contact dermatitis (carba mix, p-Phenylene-diamine, gold sodium thiosulfate)  - Continue with Eucrisa as needed.   3. Return in about 6 months (around 01/20/2023).     Please inform us of any Emergency Department visits, hospitalizations, or changes in symptoms. Call us before going to the ED for breathing or allergy symptoms since we might be able to fit you in for a sick visit. Feel free to contact us anytime with any questions, problems, or concerns.  It was a pleasure to see you and your family again today!  Websites that have reliable patient information: 1. American Academy of Asthma, Allergy, and Immunology: www.aaaai.org 2. Food Allergy Research and Education (FARE): foodallergy.org 3. Mothers of Asthmatics: http://www.asthmacommunitynetwork.org 4. American College of Allergy, Asthma, and Immunology: www.acaai.org   COVID-19 Vaccine Information can be found at: [email or web address] For questions related to vaccine distribution or appointments, please email [email or web address]@ [email or web address] or call [phone].     "Like" [place] on Facebook and Instagram for our latest updates!      Make sure you are registered to vote! If you have moved or changed any of your contact information, you will need to get this updated before voting!  In some cases, you MAY be able to register to vote online: [email or web address]

## 2022-07-23 ENCOUNTER — Encounter: Payer: Self-pay | Admitting: Allergy & Immunology

## 2022-07-30 ENCOUNTER — Ambulatory Visit (INDEPENDENT_AMBULATORY_CARE_PROVIDER_SITE_OTHER): Payer: Commercial Managed Care - PPO

## 2022-07-30 DIAGNOSIS — J309 Allergic rhinitis, unspecified: Secondary | ICD-10-CM

## 2022-08-13 ENCOUNTER — Ambulatory Visit (INDEPENDENT_AMBULATORY_CARE_PROVIDER_SITE_OTHER): Payer: Commercial Managed Care - PPO

## 2022-08-13 DIAGNOSIS — J309 Allergic rhinitis, unspecified: Secondary | ICD-10-CM

## 2022-08-27 ENCOUNTER — Ambulatory Visit (INDEPENDENT_AMBULATORY_CARE_PROVIDER_SITE_OTHER): Payer: Commercial Managed Care - PPO

## 2022-08-27 DIAGNOSIS — J309 Allergic rhinitis, unspecified: Secondary | ICD-10-CM | POA: Diagnosis not present

## 2022-09-03 ENCOUNTER — Ambulatory Visit (INDEPENDENT_AMBULATORY_CARE_PROVIDER_SITE_OTHER): Payer: Commercial Managed Care - PPO

## 2022-09-03 DIAGNOSIS — J309 Allergic rhinitis, unspecified: Secondary | ICD-10-CM

## 2022-09-17 ENCOUNTER — Ambulatory Visit (INDEPENDENT_AMBULATORY_CARE_PROVIDER_SITE_OTHER): Payer: Commercial Managed Care - PPO

## 2022-09-17 DIAGNOSIS — J309 Allergic rhinitis, unspecified: Secondary | ICD-10-CM

## 2022-10-01 ENCOUNTER — Ambulatory Visit (INDEPENDENT_AMBULATORY_CARE_PROVIDER_SITE_OTHER): Payer: Commercial Managed Care - PPO

## 2022-10-01 DIAGNOSIS — J309 Allergic rhinitis, unspecified: Secondary | ICD-10-CM | POA: Diagnosis not present

## 2022-10-08 ENCOUNTER — Ambulatory Visit (INDEPENDENT_AMBULATORY_CARE_PROVIDER_SITE_OTHER): Payer: Commercial Managed Care - PPO

## 2022-10-08 DIAGNOSIS — J309 Allergic rhinitis, unspecified: Secondary | ICD-10-CM

## 2022-10-15 ENCOUNTER — Ambulatory Visit (INDEPENDENT_AMBULATORY_CARE_PROVIDER_SITE_OTHER): Payer: Commercial Managed Care - PPO

## 2022-10-15 DIAGNOSIS — J309 Allergic rhinitis, unspecified: Secondary | ICD-10-CM

## 2022-10-16 IMAGING — MG MM DIGITAL SCREENING BILAT W/ TOMO AND CAD
8 series · 9 of 24 positions shown · non-contrast
Comparison: Previous exam(s).

CLINICAL DATA: Screening.

EXAM:
DIGITAL SCREENING BILATERAL MAMMOGRAM WITH TOMOSYNTHESIS AND CAD
TECHNIQUE: Bilateral screening digital craniocaudal and mediolateral oblique
mammograms were obtained. Bilateral screening digital breast
tomosynthesis was performed. The images were evaluated with
computer-aided detection.

[L CC synth-2D]
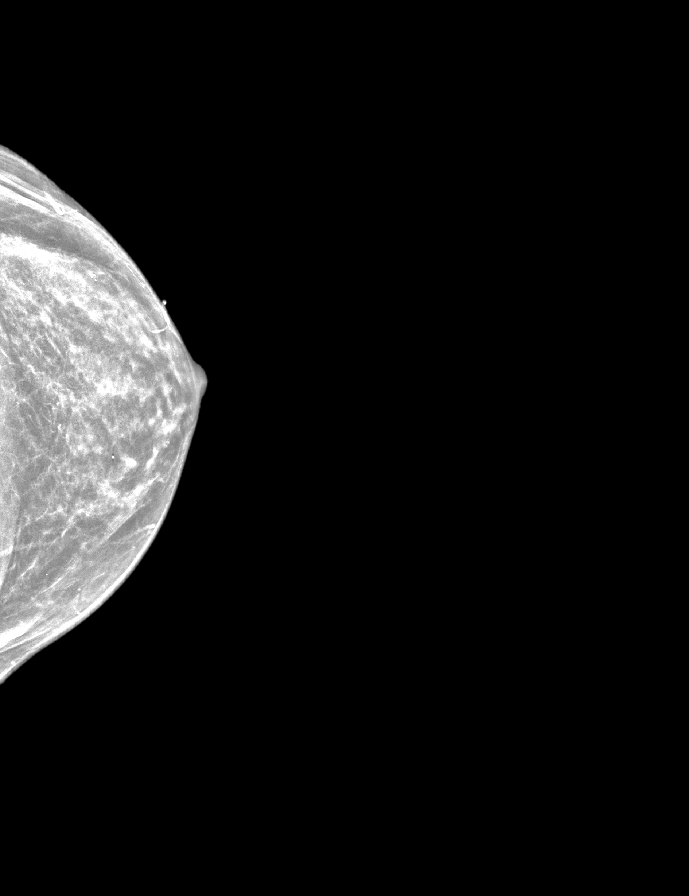

[L MLO synth-2D]
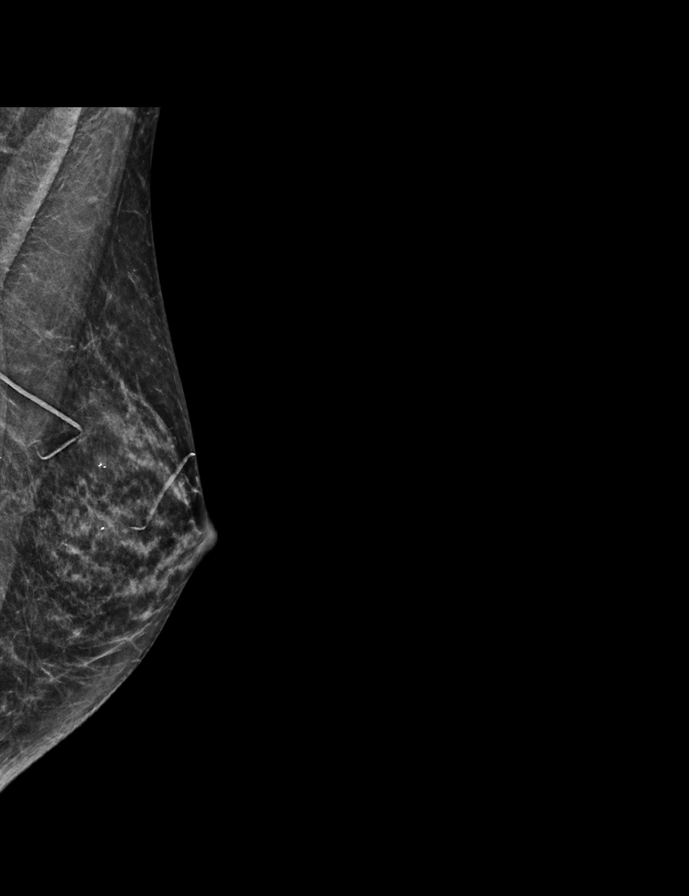

[R MLO synth-2D]
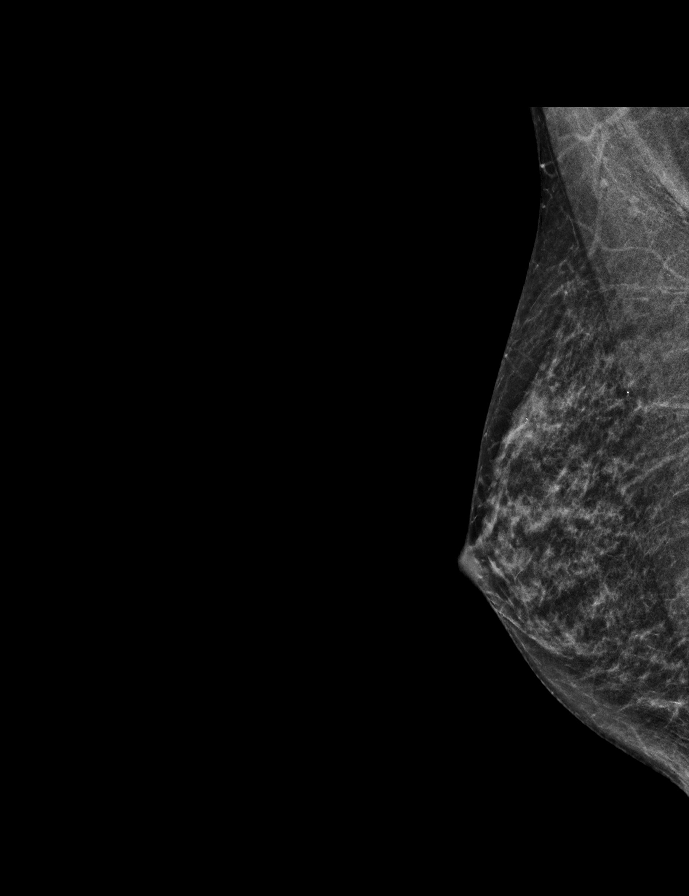

[R CC synth-2D]
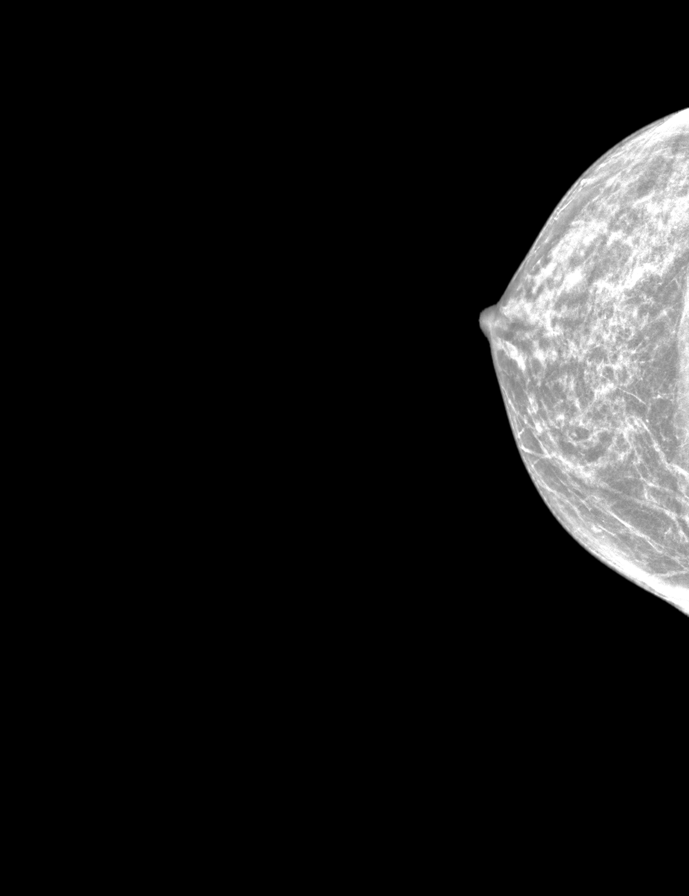

[L CC tomo · 2 of 48 frames shown]
[frame 16/48]
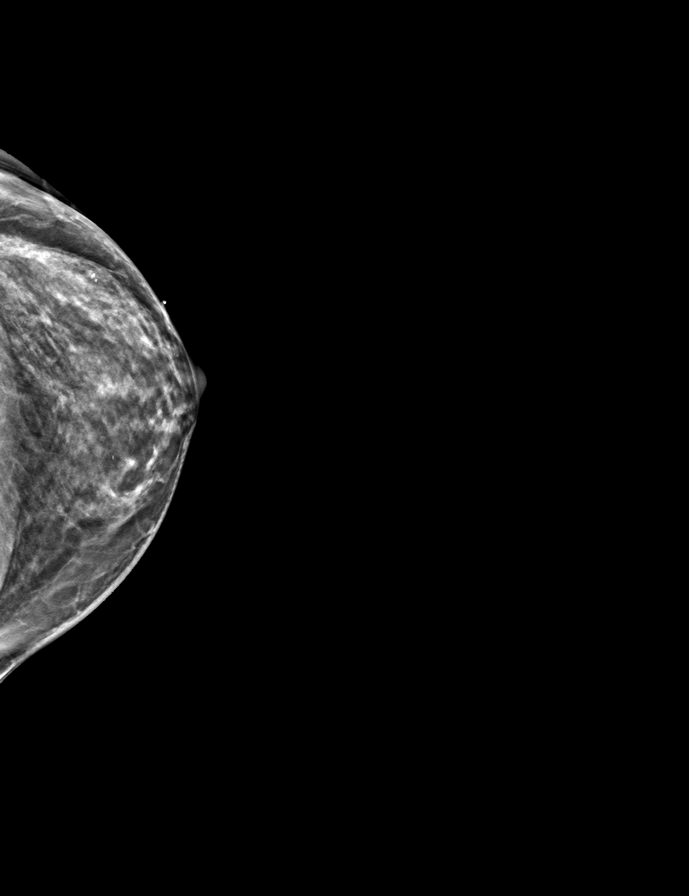
[frame 25/48]
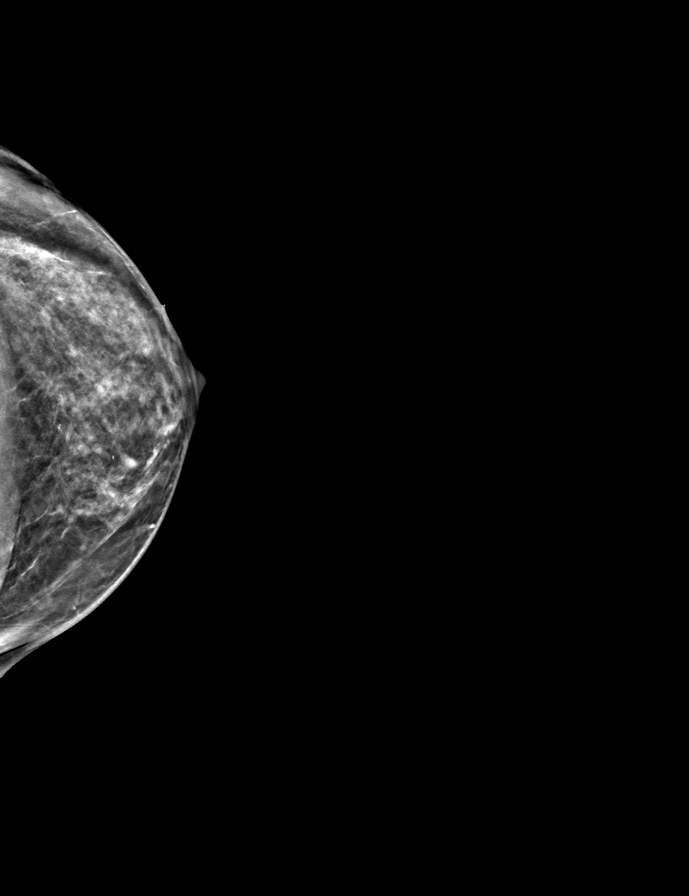

[R CC tomo · tomo slice 25/48.0]
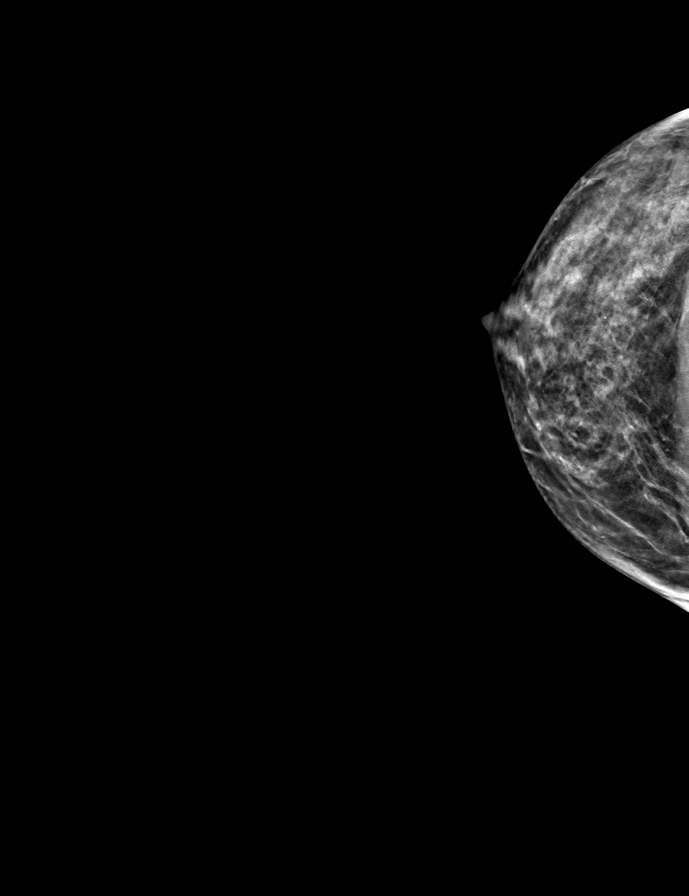

[L MLO tomo · tomo slice 23/44.0]
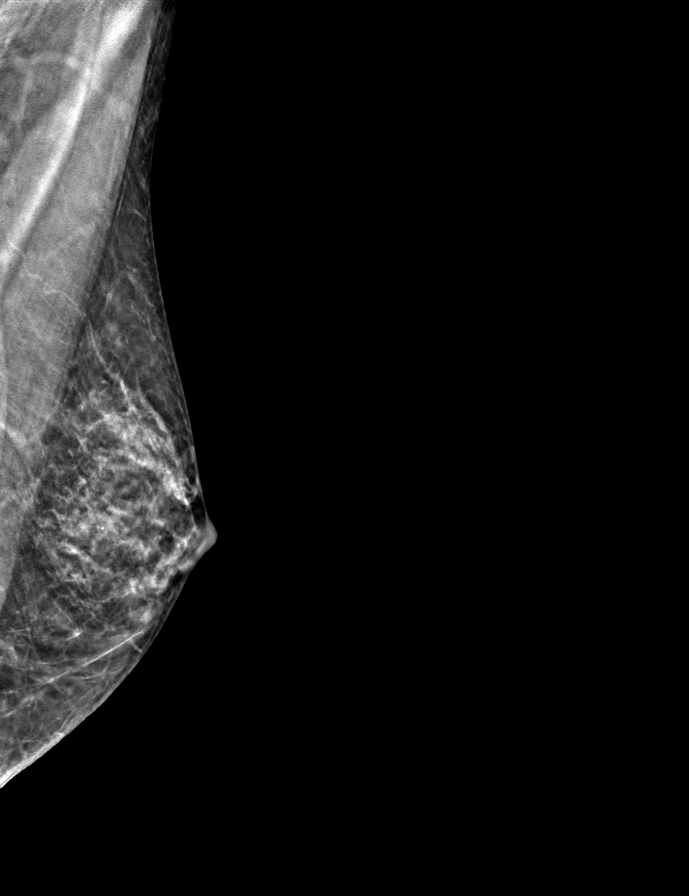

[R MLO tomo · tomo slice 21/41.0]
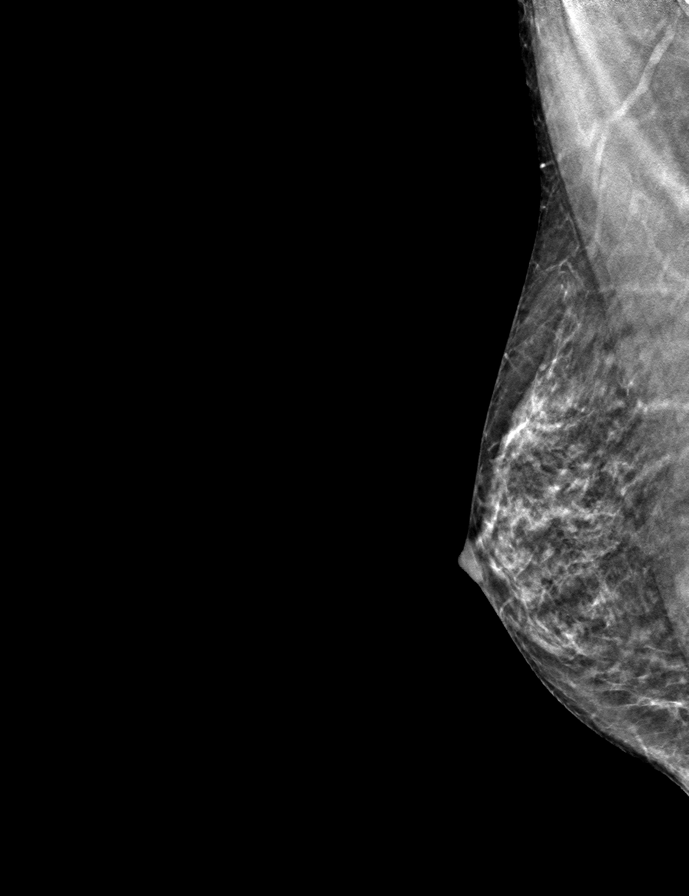

[9 of 24 positions shown; findings below may reference images not displayed]

ACR Breast Density Category c: The breast tissue is heterogeneously
dense, which may obscure small masses.
FINDINGS: There are no findings suspicious for malignancy.
IMPRESSION: No mammographic evidence of malignancy. A result letter of this
screening mammogram will be mailed directly to the patient.

RECOMMENDATION:
Screening mammogram in one year. (Code:[id number])

BI-RADS CATEGORY  1: Negative.

## 2022-10-22 ENCOUNTER — Ambulatory Visit (INDEPENDENT_AMBULATORY_CARE_PROVIDER_SITE_OTHER): Payer: Commercial Managed Care - PPO

## 2022-10-22 DIAGNOSIS — J309 Allergic rhinitis, unspecified: Secondary | ICD-10-CM

## 2022-11-05 ENCOUNTER — Ambulatory Visit (INDEPENDENT_AMBULATORY_CARE_PROVIDER_SITE_OTHER): Payer: Commercial Managed Care - PPO

## 2022-11-05 DIAGNOSIS — J309 Allergic rhinitis, unspecified: Secondary | ICD-10-CM | POA: Diagnosis not present

## 2022-11-19 ENCOUNTER — Ambulatory Visit (INDEPENDENT_AMBULATORY_CARE_PROVIDER_SITE_OTHER): Payer: Commercial Managed Care - PPO

## 2022-11-19 DIAGNOSIS — J309 Allergic rhinitis, unspecified: Secondary | ICD-10-CM

## 2022-12-03 ENCOUNTER — Ambulatory Visit (INDEPENDENT_AMBULATORY_CARE_PROVIDER_SITE_OTHER): Payer: Commercial Managed Care - PPO

## 2022-12-03 DIAGNOSIS — J309 Allergic rhinitis, unspecified: Secondary | ICD-10-CM | POA: Diagnosis not present

## 2022-12-06 ENCOUNTER — Other Ambulatory Visit: Payer: Self-pay | Admitting: Obstetrics and Gynecology

## 2022-12-06 DIAGNOSIS — Z1231 Encounter for screening mammogram for malignant neoplasm of breast: Secondary | ICD-10-CM

## 2022-12-08 ENCOUNTER — Ambulatory Visit (INDEPENDENT_AMBULATORY_CARE_PROVIDER_SITE_OTHER): Payer: Commercial Managed Care - PPO

## 2022-12-08 DIAGNOSIS — J309 Allergic rhinitis, unspecified: Secondary | ICD-10-CM

## 2022-12-17 ENCOUNTER — Ambulatory Visit (INDEPENDENT_AMBULATORY_CARE_PROVIDER_SITE_OTHER): Payer: Commercial Managed Care - PPO

## 2022-12-17 DIAGNOSIS — J309 Allergic rhinitis, unspecified: Secondary | ICD-10-CM | POA: Diagnosis not present

## 2022-12-24 ENCOUNTER — Ambulatory Visit (INDEPENDENT_AMBULATORY_CARE_PROVIDER_SITE_OTHER): Payer: Commercial Managed Care - PPO

## 2022-12-24 DIAGNOSIS — J309 Allergic rhinitis, unspecified: Secondary | ICD-10-CM

## 2023-01-04 ENCOUNTER — Ambulatory Visit
Admission: RE | Admit: 2023-01-04 | Discharge: 2023-01-04 | Disposition: A | Discharge status: Home / Self Care | Payer: Commercial Managed Care - PPO | Source: Ambulatory Visit | Attending: Obstetrics and Gynecology | Admitting: Obstetrics and Gynecology

## 2023-01-04 DIAGNOSIS — Z1231 Encounter for screening mammogram for malignant neoplasm of breast: Secondary | ICD-10-CM

## 2023-01-05 ENCOUNTER — Other Ambulatory Visit: Payer: Self-pay | Admitting: Allergy & Immunology

## 2023-01-05 ENCOUNTER — Ambulatory Visit (INDEPENDENT_AMBULATORY_CARE_PROVIDER_SITE_OTHER): Payer: Commercial Managed Care - PPO

## 2023-01-05 DIAGNOSIS — J309 Allergic rhinitis, unspecified: Secondary | ICD-10-CM

## 2023-01-05 MED ORDER — EPINEPHRINE 0.3 MG/0.3ML IJ SOAJ: 0.3000 mg | INTRAMUSCULAR | 1 refills | Status: DC | PRN

## 2023-01-12 ENCOUNTER — Ambulatory Visit (INDEPENDENT_AMBULATORY_CARE_PROVIDER_SITE_OTHER): Payer: Commercial Managed Care - PPO | Admitting: *Deleted

## 2023-01-12 DIAGNOSIS — J309 Allergic rhinitis, unspecified: Secondary | ICD-10-CM | POA: Diagnosis not present

## 2023-01-19 DIAGNOSIS — J3089 Other allergic rhinitis: Secondary | ICD-10-CM | POA: Diagnosis not present

## 2023-01-19 NOTE — Progress Notes (Signed)
VIAL EXP 03-11-23 

## 2023-01-21 ENCOUNTER — Ambulatory Visit (INDEPENDENT_AMBULATORY_CARE_PROVIDER_SITE_OTHER): Payer: Commercial Managed Care - PPO

## 2023-01-21 DIAGNOSIS — J309 Allergic rhinitis, unspecified: Secondary | ICD-10-CM | POA: Diagnosis not present

## 2023-02-02 ENCOUNTER — Ambulatory Visit (INDEPENDENT_AMBULATORY_CARE_PROVIDER_SITE_OTHER): Payer: Commercial Managed Care - PPO

## 2023-02-02 ENCOUNTER — Ambulatory Visit: Payer: Commercial Managed Care - PPO | Admitting: Allergy & Immunology

## 2023-02-02 DIAGNOSIS — J309 Allergic rhinitis, unspecified: Secondary | ICD-10-CM | POA: Diagnosis not present

## 2023-02-18 ENCOUNTER — Ambulatory Visit (INDEPENDENT_AMBULATORY_CARE_PROVIDER_SITE_OTHER): Payer: Commercial Managed Care - PPO

## 2023-02-18 DIAGNOSIS — J309 Allergic rhinitis, unspecified: Secondary | ICD-10-CM

## 2023-03-02 ENCOUNTER — Ambulatory Visit (INDEPENDENT_AMBULATORY_CARE_PROVIDER_SITE_OTHER): Payer: Commercial Managed Care - PPO

## 2023-03-02 DIAGNOSIS — J309 Allergic rhinitis, unspecified: Secondary | ICD-10-CM

## 2023-03-11 ENCOUNTER — Ambulatory Visit (INDEPENDENT_AMBULATORY_CARE_PROVIDER_SITE_OTHER): Payer: Commercial Managed Care - PPO

## 2023-03-11 DIAGNOSIS — J309 Allergic rhinitis, unspecified: Secondary | ICD-10-CM | POA: Diagnosis not present

## 2023-03-18 ENCOUNTER — Ambulatory Visit (INDEPENDENT_AMBULATORY_CARE_PROVIDER_SITE_OTHER): Payer: Commercial Managed Care - PPO

## 2023-03-18 DIAGNOSIS — J309 Allergic rhinitis, unspecified: Secondary | ICD-10-CM

## 2023-03-25 ENCOUNTER — Ambulatory Visit (INDEPENDENT_AMBULATORY_CARE_PROVIDER_SITE_OTHER): Payer: Commercial Managed Care - PPO

## 2023-03-25 DIAGNOSIS — J309 Allergic rhinitis, unspecified: Secondary | ICD-10-CM

## 2023-04-01 ENCOUNTER — Ambulatory Visit (INDEPENDENT_AMBULATORY_CARE_PROVIDER_SITE_OTHER): Payer: Commercial Managed Care - PPO | Admitting: *Deleted

## 2023-04-01 DIAGNOSIS — J309 Allergic rhinitis, unspecified: Secondary | ICD-10-CM | POA: Diagnosis not present

## 2023-04-08 ENCOUNTER — Ambulatory Visit: Payer: Commercial Managed Care - PPO | Admitting: Family Medicine

## 2023-04-08 ENCOUNTER — Ambulatory Visit (INDEPENDENT_AMBULATORY_CARE_PROVIDER_SITE_OTHER): Payer: Commercial Managed Care - PPO

## 2023-04-08 DIAGNOSIS — J309 Allergic rhinitis, unspecified: Secondary | ICD-10-CM | POA: Diagnosis not present

## 2023-04-22 ENCOUNTER — Ambulatory Visit (INDEPENDENT_AMBULATORY_CARE_PROVIDER_SITE_OTHER): Payer: Self-pay

## 2023-04-22 DIAGNOSIS — J309 Allergic rhinitis, unspecified: Secondary | ICD-10-CM | POA: Diagnosis not present

## 2023-05-06 ENCOUNTER — Ambulatory Visit (INDEPENDENT_AMBULATORY_CARE_PROVIDER_SITE_OTHER): Payer: Self-pay

## 2023-05-06 DIAGNOSIS — J309 Allergic rhinitis, unspecified: Secondary | ICD-10-CM

## 2023-05-18 ENCOUNTER — Ambulatory Visit (INDEPENDENT_AMBULATORY_CARE_PROVIDER_SITE_OTHER)

## 2023-05-18 DIAGNOSIS — J309 Allergic rhinitis, unspecified: Secondary | ICD-10-CM

## 2023-06-01 ENCOUNTER — Ambulatory Visit (INDEPENDENT_AMBULATORY_CARE_PROVIDER_SITE_OTHER): Payer: Self-pay

## 2023-06-01 DIAGNOSIS — J309 Allergic rhinitis, unspecified: Secondary | ICD-10-CM

## 2023-06-08 ENCOUNTER — Ambulatory Visit: Payer: Commercial Managed Care - PPO | Admitting: Family Medicine

## 2023-06-10 ENCOUNTER — Ambulatory Visit (INDEPENDENT_AMBULATORY_CARE_PROVIDER_SITE_OTHER): Payer: Self-pay

## 2023-06-10 DIAGNOSIS — J309 Allergic rhinitis, unspecified: Secondary | ICD-10-CM

## 2023-06-17 ENCOUNTER — Ambulatory Visit (INDEPENDENT_AMBULATORY_CARE_PROVIDER_SITE_OTHER)

## 2023-06-17 DIAGNOSIS — J309 Allergic rhinitis, unspecified: Secondary | ICD-10-CM | POA: Diagnosis not present

## 2023-06-24 ENCOUNTER — Ambulatory Visit (INDEPENDENT_AMBULATORY_CARE_PROVIDER_SITE_OTHER)

## 2023-06-24 DIAGNOSIS — J309 Allergic rhinitis, unspecified: Secondary | ICD-10-CM | POA: Diagnosis not present

## 2023-07-08 ENCOUNTER — Ambulatory Visit (INDEPENDENT_AMBULATORY_CARE_PROVIDER_SITE_OTHER)

## 2023-07-08 DIAGNOSIS — J309 Allergic rhinitis, unspecified: Secondary | ICD-10-CM

## 2023-07-21 NOTE — Progress Notes (Signed)
 [address] [place] [address] Dept: [phone]  FOLLOW UP NOTE  Patient ID: Kayla [name], female    DOB: 11/15/1960  Age: 63 y.o. MRN: [id number] Date of Office Visit: 07/22/2023  Assessment  Chief Complaint: Follow-up (Allergies/No concerns)  HPI Kayla [name] is a 63 year old female who presents to the clinic for a follow up visit. She was last seen in this clinic on 07/21/2022 by Dr. Idolina Maker for evaluation of allergic rhinitis on allergen immunotherapy directed toward grass pollen, weed pollen, tree pollen, cat, and dust mites. She began allergen immunotherapy in 2016.  At today's visit, she reports allergic rhinitis has been moderately well-controlled with nasal congestion and postnasal drainage as the main symptoms.  She takes Allegra  as needed and uses nasal saline rinses and nasal saline gel as needed for dry nostrils.  She continues allergen immunotherapy directed toward grass pollen, weed pollen, tree pollen, dust mite, and cat with occasional local reactions.  She reports a significant decrease in her symptoms of allergic rhinitis while continuing on allergen immunotherapy. EpiPen  set is up to date.   She has previously seen Dr. Darlin Ehrlich, ENT specialist.  Due to insurance requirements, she is changing over to [facility] with an upcoming initial patient consult.   She reports frequent red and itchy eyes for which she continues Restasis that she got from her eye doctor.  She infrequently uses a lubricating eyedrop.  Atopic dermatitis is reported as moderately well-controlled with occasional red and itchy areas.  She continues a daily moisturizing routine as well as Eucrisa  frequently.  She reports that she has not needed to use Elidel or triamcinolone  since her last visit to this clinic.  She does continue to follow-up with her dermatologist as recommended.  Her current medications are listed in the chart.  Drug Allergies:  Allergies  Allergen  Reactions   Sulfonamide Derivatives    Sulfa Antibiotics Rash    Physical Exam: BP (!) 150/90   Pulse 91   Temp 98 F (36.7 C)   Resp 12   LMP 02/11/2012   SpO2 97%    Physical Exam Vitals reviewed.  Constitutional:      Appearance: Normal appearance.  HENT:     Head: Normocephalic and atraumatic.     Right Ear: Tympanic membrane normal.     Left Ear: Tympanic membrane normal.     Nose:     Comments: Bilateral nares slightly erythematous with thin clear nasal drainage noted.  Pharynx normal.  Ears normal.  Eyes normal.    Mouth/Throat:     Pharynx: Oropharynx is clear.  Eyes:     Conjunctiva/sclera: Conjunctivae normal.  Cardiovascular:     Rate and Rhythm: Normal rate and regular rhythm.     Heart sounds: Normal heart sounds. No murmur heard. Pulmonary:     Effort: Pulmonary effort is normal.     Breath sounds: Normal breath sounds.     Comments: Lungs clear to auscultation Musculoskeletal:        General: Normal range of motion.     Cervical back: Normal range of motion and neck supple.  Skin:    General: Skin is warm and dry.  Neurological:     Mental Status: She is alert and oriented to person, place, and time.  Psychiatric:        Mood and Affect: Mood normal.        Behavior: Behavior normal.        Thought Content:  Thought content normal.        Judgment: Judgment normal.    Assessment and Plan: 1. Seasonal and perennial allergic rhinitis   2. Allergic contact dermatitis, unspecified trigger   3. Intrinsic atopic dermatitis     Patient Instructions  Allergic rhinitis Continue allergen immunotherapy directed toward grass pollen, weed pollen, tree pollen, cat, and dust mites as listed below Continue allergen immunotherapy and have access to an Epipen  Continue Allegra  180 mg once a day if needed for a runny nose or itch Consider saline nasal rinses as needed for nasal symptoms. Use this before any medicated nasal sprays for best result  Contact  dermatitis Continue to avoid carba mix, p-Phenylene-diamine, gold sodium thiosulfate  Atopic dermatitis Continue a twice a day moisturizing routine Continue Eucrisa  up to twice a day if needed Continue to follow up with your dermatology specialist as recommended  Call the clinic if this treatment plan is not working well for you  Follow up in 1 year or sooner if needed.   Return in about 1 year (around 07/21/2024), or if symptoms worsen or fail to improve.    Thank you for the opportunity to care for this patient.  Please do not hesitate to contact me with questions.  Marinus Sic, FNP [facility] 

## 2023-07-21 NOTE — Patient Instructions (Incomplete)
 Allergic rhinitis Continue allergen immunotherapy directed toward grass pollen, weed pollen, tree pollen, cat, and dust mites as listed below Continue allergen immunotherapy and have access to an Epipen  Continue Allegra  180 mg once a day if needed for a runny nose or itch Consider saline nasal rinses as needed for nasal symptoms. Use this before any medicated nasal sprays for best result  Contact dermatitis Continue to avoid carba mix, p-Phenylene-diamine, gold sodium thiosulfate  Atopic dermatitis Continue a twice a day moisturizing routine Continue Eucrisa  up to twice a day if needed Continue to follow up with your dermatology specialist as recommended  Your blood pressure was elevated at today's visit. Follow up with your primary provider for further evaluation  Call the clinic if this treatment plan is not working well for you  Follow up in 1 year or sooner if needed.  Reducing Pollen Exposure The American Academy of Allergy , Asthma and Immunology suggests the following steps to reduce your exposure to pollen during allergy  seasons. Do not hang sheets or clothing out to dry; pollen may collect on these items. Do not mow lawns or spend time around freshly cut grass; mowing stirs up pollen. Keep windows closed at night.  Keep car windows closed while driving. Minimize morning activities outdoors, a time when pollen counts are usually at their highest. Stay indoors as much as possible when pollen counts or humidity is high and on windy days when pollen tends to remain in the air longer. Use air conditioning when possible.  Many air conditioners have filters that trap the pollen spores. Use a HEPA room air filter to remove pollen form the indoor air you breathe.  Control of Dog or Cat Allergen Avoidance is the best way to manage a dog or cat allergy . If you have a dog or cat and are allergic to dog or cats, consider removing the dog or cat from the home. If you have a dog or cat but  don't want to find it a new home, or if your family wants a pet even though someone in the household is allergic, here are some strategies that may help keep symptoms at bay:  Keep the pet out of your bedroom and restrict it to only a few rooms. Be advised that keeping the dog or cat in only one room will not limit the allergens to that room. Don't pet, hug or kiss the dog or cat; if you do, wash your hands with soap and water . High-efficiency particulate air (HEPA) cleaners run continuously in a bedroom or living room can reduce allergen levels over time. Regular use of a high-efficiency vacuum cleaner or a central vacuum can reduce allergen levels. Giving your dog or cat a bath at least once a week can reduce airborne allergen.   Control of Dust Mite Allergen Dust mites play a major role in allergic asthma and rhinitis. They occur in environments with high humidity wherever human skin is found. Dust mites absorb humidity from the atmosphere (ie, they do not drink) and feed on organic matter (including shed human and animal skin). Dust mites are a microscopic type of insect that you cannot see with the naked eye. High levels of dust mites have been detected from mattresses, pillows, carpets, upholstered furniture, bed covers, clothes, soft toys and any woven material. The principal allergen of the dust mite is found in its feces. A gram of dust may contain 1,000 mites and 250,000 fecal particles. Mite antigen is easily measured in the air during house  cleaning activities. Dust mites do not bite and do not cause harm to humans, other than by triggering allergies/asthma.  Ways to decrease your exposure to dust mites in your home:  1. Encase mattresses, box springs and pillows with a mite-impermeable barrier or cover  2. Wash sheets, blankets and drapes weekly in hot water  (130 F) with detergent and dry them in a dryer on the hot setting.  3. Have the room cleaned frequently with a vacuum cleaner  and a damp dust-mop. For carpeting or rugs, vacuuming with a vacuum cleaner equipped with a high-efficiency particulate air (HEPA) filter. The dust mite allergic individual should not be in a room which is being cleaned and should wait 1 hour after cleaning before going into the room.  4. Do not sleep on upholstered furniture (eg, couches).  5. If possible removing carpeting, upholstered furniture and drapery from the home is ideal. Horizontal blinds should be eliminated in the rooms where the person spends the most time (bedroom, study, television room). Washable vinyl, roller-type shades are optimal.  6. Remove all non-washable stuffed toys from the bedroom. Wash stuffed toys weekly like sheets and blankets above.  7. Reduce indoor humidity to less than 50%. Inexpensive humidity monitors can be purchased at most hardware stores. Do not use a humidifier as can make the problem worse and are not recommended.

## 2023-07-22 ENCOUNTER — Other Ambulatory Visit: Payer: Self-pay

## 2023-07-22 ENCOUNTER — Ambulatory Visit: Payer: Self-pay

## 2023-07-22 ENCOUNTER — Ambulatory Visit: Admitting: Family Medicine

## 2023-07-22 ENCOUNTER — Encounter: Payer: Self-pay | Admitting: Family Medicine

## 2023-07-22 VITALS — BP 150/90 | HR 91 | Temp 98.0°F | Resp 12

## 2023-07-22 DIAGNOSIS — L239 Allergic contact dermatitis, unspecified cause: Secondary | ICD-10-CM | POA: Diagnosis not present

## 2023-07-22 DIAGNOSIS — L2084 Intrinsic (allergic) eczema: Secondary | ICD-10-CM | POA: Diagnosis not present

## 2023-07-22 DIAGNOSIS — J3089 Other allergic rhinitis: Secondary | ICD-10-CM

## 2023-07-22 DIAGNOSIS — J309 Allergic rhinitis, unspecified: Secondary | ICD-10-CM

## 2023-07-22 DIAGNOSIS — J302 Other seasonal allergic rhinitis: Secondary | ICD-10-CM

## 2023-07-22 MED ORDER — EUCRISA 2 % EX OINT: 1.0000 "application " | TOPICAL_OINTMENT | Freq: Two times a day (BID) | CUTANEOUS | 11 refills | Status: DC

## 2023-07-22 MED ORDER — FEXOFENADINE HCL 180 MG PO TABS: 180.0000 mg | ORAL_TABLET | Freq: Every day | ORAL | 11 refills | Status: AC | PRN

## 2023-08-05 ENCOUNTER — Ambulatory Visit (INDEPENDENT_AMBULATORY_CARE_PROVIDER_SITE_OTHER): Payer: Self-pay

## 2023-08-05 DIAGNOSIS — J309 Allergic rhinitis, unspecified: Secondary | ICD-10-CM

## 2023-08-08 DIAGNOSIS — J3089 Other allergic rhinitis: Secondary | ICD-10-CM | POA: Diagnosis not present

## 2023-08-08 NOTE — Progress Notes (Signed)
 VIAL MADE 08-08-23

## 2023-08-12 ENCOUNTER — Ambulatory Visit (INDEPENDENT_AMBULATORY_CARE_PROVIDER_SITE_OTHER)

## 2023-08-12 DIAGNOSIS — J309 Allergic rhinitis, unspecified: Secondary | ICD-10-CM

## 2023-08-16 ENCOUNTER — Other Ambulatory Visit: Payer: Self-pay | Admitting: Family Medicine

## 2023-08-22 ENCOUNTER — Telehealth: Payer: Self-pay

## 2023-08-22 ENCOUNTER — Other Ambulatory Visit (HOSPITAL_COMMUNITY): Payer: Self-pay

## 2023-08-22 NOTE — Telephone Encounter (Signed)
*  AA  Pharmacy Patient Advocate Encounter   Received notification from RX Request Messages that prior authorization for Eucrisa  2% ointment  is required/requested.   Insurance verification completed.   The patient is insured through [employer] .   Per test claim: PA required; PA submitted to above mentioned insurance via CoverMyMeds Key/confirmation #/EOC [id number] Status is pending

## 2023-08-22 NOTE — Telephone Encounter (Signed)
 PA request has been Received. New Encounter has been or will be created for follow up. For additional info see Pharmacy Prior Auth telephone encounter from 07/07.

## 2023-08-22 NOTE — Telephone Encounter (Signed)
 Pharmacy Patient Advocate Encounter  Received notification from [facility] that Prior Authorization for Eucrisa  2% ointment  has been APPROVED from 08/22/2023 to 08/21/2024. Ran test claim, Copay is $80.00. This test claim was processed through [facility]- copay amounts may vary at other pharmacies due to pharmacy/plan contracts, or as the patient moves through the different stages of their insurance plan.   [employer] the [employer] of EUCRISA  2% OINTMENT paid 170.00 toward your plan copay. $3710.00 out of $3880.00 in benefits remaining.

## 2023-08-26 ENCOUNTER — Ambulatory Visit (INDEPENDENT_AMBULATORY_CARE_PROVIDER_SITE_OTHER)

## 2023-08-26 DIAGNOSIS — J309 Allergic rhinitis, unspecified: Secondary | ICD-10-CM

## 2023-08-31 NOTE — Telephone Encounter (Signed)
 I called the patient and had to leave voicemail for a return call.

## 2023-09-02 ENCOUNTER — Ambulatory Visit (INDEPENDENT_AMBULATORY_CARE_PROVIDER_SITE_OTHER): Payer: Self-pay

## 2023-09-02 DIAGNOSIS — J309 Allergic rhinitis, unspecified: Secondary | ICD-10-CM

## 2023-09-16 ENCOUNTER — Ambulatory Visit (INDEPENDENT_AMBULATORY_CARE_PROVIDER_SITE_OTHER): Payer: Self-pay

## 2023-09-16 DIAGNOSIS — J309 Allergic rhinitis, unspecified: Secondary | ICD-10-CM

## 2023-09-28 ENCOUNTER — Encounter (INDEPENDENT_AMBULATORY_CARE_PROVIDER_SITE_OTHER): Payer: Self-pay | Admitting: *Deleted

## 2023-09-30 ENCOUNTER — Ambulatory Visit (INDEPENDENT_AMBULATORY_CARE_PROVIDER_SITE_OTHER)

## 2023-09-30 DIAGNOSIS — J309 Allergic rhinitis, unspecified: Secondary | ICD-10-CM | POA: Diagnosis not present

## 2023-10-07 ENCOUNTER — Ambulatory Visit (INDEPENDENT_AMBULATORY_CARE_PROVIDER_SITE_OTHER)

## 2023-10-07 DIAGNOSIS — J309 Allergic rhinitis, unspecified: Secondary | ICD-10-CM | POA: Diagnosis not present

## 2023-10-14 ENCOUNTER — Ambulatory Visit (INDEPENDENT_AMBULATORY_CARE_PROVIDER_SITE_OTHER): Payer: Self-pay

## 2023-10-14 DIAGNOSIS — J309 Allergic rhinitis, unspecified: Secondary | ICD-10-CM

## 2023-10-21 ENCOUNTER — Ambulatory Visit (INDEPENDENT_AMBULATORY_CARE_PROVIDER_SITE_OTHER): Payer: Self-pay

## 2023-10-21 DIAGNOSIS — J309 Allergic rhinitis, unspecified: Secondary | ICD-10-CM

## 2023-11-04 ENCOUNTER — Ambulatory Visit (INDEPENDENT_AMBULATORY_CARE_PROVIDER_SITE_OTHER): Payer: Self-pay

## 2023-11-04 DIAGNOSIS — J309 Allergic rhinitis, unspecified: Secondary | ICD-10-CM

## 2023-11-11 ENCOUNTER — Ambulatory Visit (INDEPENDENT_AMBULATORY_CARE_PROVIDER_SITE_OTHER): Payer: Self-pay

## 2023-11-11 DIAGNOSIS — J309 Allergic rhinitis, unspecified: Secondary | ICD-10-CM | POA: Diagnosis not present

## 2023-11-18 ENCOUNTER — Ambulatory Visit (INDEPENDENT_AMBULATORY_CARE_PROVIDER_SITE_OTHER)

## 2023-11-18 DIAGNOSIS — J309 Allergic rhinitis, unspecified: Secondary | ICD-10-CM | POA: Diagnosis not present

## 2023-12-02 ENCOUNTER — Ambulatory Visit

## 2023-12-02 DIAGNOSIS — J309 Allergic rhinitis, unspecified: Secondary | ICD-10-CM | POA: Diagnosis not present

## 2023-12-09 ENCOUNTER — Ambulatory Visit (INDEPENDENT_AMBULATORY_CARE_PROVIDER_SITE_OTHER): Payer: Self-pay

## 2023-12-09 DIAGNOSIS — J309 Allergic rhinitis, unspecified: Secondary | ICD-10-CM | POA: Diagnosis not present

## 2023-12-12 ENCOUNTER — Other Ambulatory Visit: Payer: Self-pay | Admitting: Obstetrics and Gynecology

## 2023-12-12 DIAGNOSIS — Z1231 Encounter for screening mammogram for malignant neoplasm of breast: Secondary | ICD-10-CM

## 2023-12-16 ENCOUNTER — Ambulatory Visit (INDEPENDENT_AMBULATORY_CARE_PROVIDER_SITE_OTHER)

## 2023-12-16 DIAGNOSIS — J309 Allergic rhinitis, unspecified: Secondary | ICD-10-CM

## 2023-12-19 ENCOUNTER — Telehealth: Payer: Self-pay

## 2023-12-19 NOTE — Telephone Encounter (Signed)
 Who is your primary care physician: Gaither Langton  Reasons for the colonoscopy: Hx polyps  Have you had a colonoscopy before?  Yes 2018  Do you have family history of colon cancer? no  Previous colonoscopy with polyps removed? yes  Do you have a history colorectal cancer?   no  Are you diabetic? If yes, Type 1 or Type 2?    no  Do you have a prosthetic or mechanical heart valve? no  Do you have a pacemaker/defibrillator?   no  Have you had endocarditis/atrial fibrillation? no  Have you had joint replacement within the last 12 months?  no  Do you tend to be constipated or have to use laxatives? no  Do you have any history of drugs or alchohol?  no  Do you use supplemental oxygen?  no  Have you had a stroke or heart attack within the last 6 months? no  Do you take weight loss medication?  no  For female patients: have you had a hysterectomy?  no                                     are you post menopausal?       yes                                            do you still have your menstrual cycle? no      Do you take any blood-thinning medications such as: (aspirin, warfarin, Plavix, Aggrenox)  no  If yes we need the name, milligram, dosage and who is prescribing doctor  Current Outpatient Medications on File Prior to Visit  Medication Sig Dispense Refill   Carboxymeth-Glyc-Polysorb PF (REFRESH OPTIVE MEGA-3) 0.5-1-0.5 % SOLN Apply to eye.     EPINEPHrine  0.3 mg/0.3 mL IJ SOAJ injection Inject 0.3 mg into the muscle as needed for anaphylaxis. 2 each 1   fexofenadine  (ALLEGRA ) 180 MG tablet Take 1 tablet (180 mg total) by mouth daily as needed. 30 tablet 11   RESTASIS 0.05 % ophthalmic emulsion 1 drop 2 (two) times daily.     No current facility-administered medications on file prior to visit.    Allergies  Allergen Reactions   Sulfonamide Derivatives    Sulfa Antibiotics Rash and Dermatitis     Pharmacy: [facility]  Primary Insurance Name:  VONDA [id number]  Best number where you can be reached: [phone]

## 2023-12-20 NOTE — Telephone Encounter (Signed)
Ok to schedule.  Room :Any   Thanks,  Vista Lawman, MD Gastroenterology and Hepatology [facility]

## 2023-12-20 NOTE — Telephone Encounter (Signed)
 LMOVM to return call  Pt wishes to be scheduled with Dr.Castaneda

## 2023-12-21 ENCOUNTER — Encounter: Payer: Self-pay | Admitting: *Deleted

## 2023-12-21 ENCOUNTER — Other Ambulatory Visit: Payer: Self-pay | Admitting: *Deleted

## 2023-12-21 MED ORDER — PEG 3350-KCL-NA BICARB-NACL 420 G PO SOLR
4000.0000 mL | Freq: Once | ORAL | 0 refills | Status: AC
Start: 2023-12-21 — End: 2023-12-21

## 2023-12-21 NOTE — Telephone Encounter (Signed)
 Pt has been scheduled for 02/01/24. Pt requested to have Dr.Castaneda to do her procedure. Instructions mailed and prep sent to pharmacy

## 2023-12-21 NOTE — Telephone Encounter (Signed)
 Questionnaire from recall, no referral needed

## 2023-12-30 ENCOUNTER — Ambulatory Visit (INDEPENDENT_AMBULATORY_CARE_PROVIDER_SITE_OTHER)

## 2023-12-30 DIAGNOSIS — J309 Allergic rhinitis, unspecified: Secondary | ICD-10-CM

## 2024-01-05 ENCOUNTER — Ambulatory Visit
Admission: RE | Admit: 2024-01-05 | Discharge: 2024-01-05 | Disposition: A | Discharge status: Home / Self Care | Source: Ambulatory Visit | Attending: Obstetrics and Gynecology | Admitting: Obstetrics and Gynecology

## 2024-01-05 DIAGNOSIS — Z1231 Encounter for screening mammogram for malignant neoplasm of breast: Secondary | ICD-10-CM

## 2024-01-06 ENCOUNTER — Ambulatory Visit (INDEPENDENT_AMBULATORY_CARE_PROVIDER_SITE_OTHER): Payer: Self-pay

## 2024-01-06 DIAGNOSIS — J309 Allergic rhinitis, unspecified: Secondary | ICD-10-CM | POA: Diagnosis not present

## 2024-01-20 ENCOUNTER — Ambulatory Visit

## 2024-01-20 DIAGNOSIS — J309 Allergic rhinitis, unspecified: Secondary | ICD-10-CM

## 2024-01-26 NOTE — Telephone Encounter (Signed)
 UMR PA : Case ID:  [id number] From Date To Date Units Status Reason 02/01/2024 04/30/2024 1 Approve

## 2024-01-26 NOTE — Telephone Encounter (Signed)
 [facility]MR PA: Case ID# [id number] was submitted on 01-26-2024

## 2024-02-01 ENCOUNTER — Ambulatory Visit (HOSPITAL_COMMUNITY): Admitting: Certified Registered"

## 2024-02-01 ENCOUNTER — Encounter (HOSPITAL_COMMUNITY): Payer: Self-pay | Admitting: Gastroenterology

## 2024-02-01 ENCOUNTER — Other Ambulatory Visit: Payer: Self-pay

## 2024-02-01 ENCOUNTER — Ambulatory Visit (HOSPITAL_COMMUNITY)
Admission: RE | Admit: 2024-02-01 | Discharge: 2024-02-01 | Disposition: A | Attending: Gastroenterology | Admitting: Gastroenterology

## 2024-02-01 ENCOUNTER — Encounter (HOSPITAL_COMMUNITY): Admission: RE | Disposition: A | Payer: Self-pay | Attending: Gastroenterology

## 2024-02-01 DIAGNOSIS — D122 Benign neoplasm of ascending colon: Secondary | ICD-10-CM | POA: Insufficient documentation

## 2024-02-01 DIAGNOSIS — I10 Essential (primary) hypertension: Secondary | ICD-10-CM | POA: Insufficient documentation

## 2024-02-01 DIAGNOSIS — Z860101 Personal history of adenomatous and serrated colon polyps: Secondary | ICD-10-CM | POA: Diagnosis not present

## 2024-02-01 DIAGNOSIS — K219 Gastro-esophageal reflux disease without esophagitis: Secondary | ICD-10-CM | POA: Insufficient documentation

## 2024-02-01 DIAGNOSIS — Z1211 Encounter for screening for malignant neoplasm of colon: Secondary | ICD-10-CM | POA: Insufficient documentation

## 2024-02-01 DIAGNOSIS — F419 Anxiety disorder, unspecified: Secondary | ICD-10-CM | POA: Diagnosis not present

## 2024-02-01 DIAGNOSIS — K635 Polyp of colon: Secondary | ICD-10-CM | POA: Diagnosis not present

## 2024-02-01 DIAGNOSIS — D120 Benign neoplasm of cecum: Secondary | ICD-10-CM | POA: Insufficient documentation

## 2024-02-01 DIAGNOSIS — K648 Other hemorrhoids: Secondary | ICD-10-CM | POA: Diagnosis not present

## 2024-02-01 DIAGNOSIS — Z79899 Other long term (current) drug therapy: Secondary | ICD-10-CM | POA: Insufficient documentation

## 2024-02-01 DIAGNOSIS — Z860100 Personal history of colon polyps, unspecified: Secondary | ICD-10-CM

## 2024-02-01 HISTORY — PX: COLONOSCOPY: SHX5424

## 2024-02-01 HISTORY — PX: POLYPECTOMY: SHX149

## 2024-02-01 LAB — HM COLONOSCOPY

## 2024-02-01 SURGERY — COLONOSCOPY
Anesthesia: Monitor Anesthesia Care

## 2024-02-01 MED ORDER — LACTATED RINGERS IV SOLN: INTRAVENOUS | Status: DC | PRN

## 2024-02-01 MED ORDER — LIDOCAINE HCL (CARDIAC) PF 100 MG/5ML IV SOSY
PREFILLED_SYRINGE | INTRAVENOUS | Status: DC | PRN
  Administered 2024-02-01: 08:00:00 30 mg via INTRAVENOUS

## 2024-02-01 MED ORDER — EPHEDRINE SULFATE (PRESSORS) 25 MG/5ML IV SOSY
PREFILLED_SYRINGE | INTRAVENOUS | Status: DC | PRN
  Administered 2024-02-01: 08:00:00 10 mg via INTRAVENOUS

## 2024-02-01 MED ORDER — PROPOFOL 10 MG/ML IV BOLUS
INTRAVENOUS | Status: DC | PRN
  Administered 2024-02-01: 08:00:00 150 ug/kg/min via INTRAVENOUS
  Administered 2024-02-01: 08:00:00 100 mg via INTRAVENOUS

## 2024-02-01 MED ORDER — LACTATED RINGERS IV SOLN: INTRAVENOUS | Status: DC

## 2024-02-01 NOTE — Op Note (Signed)
 [facility] Patient Name: Kayla [name] Procedure Date: 02/01/2024 6:47 AM MRN: [id number] Date of Birth: May 28, 1960 Attending MD: Toribio Fortune , , [id number] CSN: [id number] Age: 63 Admit Type: Outpatient Procedure:                Colonoscopy Indications:              Surveillance: Personal history of adenomatous                            polyps on last colonoscopy > 5 years ago Providers:                Toribio Fortune, Rosina Jackolyn Dorcas Alm,                            Technician Referring MD:              Medicines:                Monitored Anesthesia Care Complications:            No immediate complications. Estimated Blood Loss:     Estimated blood loss: none. Procedure:                Pre-Anesthesia Assessment:                           - Prior to the procedure, a History and Physical                            was performed, and patient medications, allergies                            and sensitivities were reviewed. The patient's                            tolerance of previous anesthesia was reviewed.                           - The risks and benefits of the procedure and the                            sedation options and risks were discussed with the                            patient. All questions were answered and informed                            consent was obtained.                           - ASA Grade Assessment: I - A normal, healthy                            patient.                           After obtaining informed consent, the colonoscope  was passed under direct vision. Throughout the                            procedure, the patient's blood pressure, pulse, and                            oxygen saturations were monitored continuously. The                            [id number]-[id number] ([id number]) Peds Colon was introduced                            through the anus and advanced to the the cecum,                             identified by appendiceal orifice and ileocecal                            valve. The colonoscopy was performed without                            difficulty. The patient tolerated the procedure                            well. The quality of the bowel preparation was                            excellent. Scope In: 8:02:31 AM Scope Out: 8:22:05 AM Scope Withdrawal Time: 0 hours 13 minutes 39 seconds  Total Procedure Duration: 0 hours 19 minutes 34 seconds  Findings:      The perianal and digital rectal examinations were normal.      Four sessile polyps were found in the ascending colon and cecum. The       polyps were 1 to 5 mm in size. These polyps were removed with a cold       snare. Resection and retrieval were complete.      Non-bleeding internal hemorrhoids were found during retroflexion. The       hemorrhoids were small. Impression:               - Four 1 to 5 mm polyps in the ascending colon and                            in the cecum, removed with a cold snare. Resected                            and retrieved.                           - Non-bleeding internal hemorrhoids. Moderate Sedation:      Per Anesthesia Care Recommendation:           - Discharge patient to home (ambulatory).                           - Resume previous diet.                           -  Await pathology results.                           - Repeat colonoscopy for surveillance based on                            pathology results. Procedure Code(s):        --- Professional ---                           [phone], Colonoscopy, flexible; with removal of                            tumor(s), polyp(s), or other lesion(s) by snare                            technique Diagnosis Code(s):        --- Professional ---                           Z86.010, Personal history of colonic polyps                           D12.2, Benign neoplasm of ascending colon                           D12.0, Benign neoplasm of cecum                            K64.8, Other hemorrhoids CPT copyright 2022 American Medical Association. All rights reserved. The codes documented in this report are preliminary and upon coder review may  be revised to meet current compliance requirements. Toribio Fortune, MD Toribio Fortune,  02/01/2024 8:27:17 AM This report has been signed electronically. Number of Addenda: 0

## 2024-02-01 NOTE — Transfer of Care (Signed)
 Immediate Anesthesia Transfer of Care Note  Patient: Kayla [name]  Procedure(s) Performed: COLONOSCOPY POLYPECTOMY, INTESTINE  Patient Location: Endoscopy Unit  Anesthesia Type:General  Level of Consciousness: awake, alert , oriented, and patient cooperative  Airway & Oxygen Therapy: Patient Spontanous Breathing  Post-op Assessment: Report given to RN and Post -op Vital signs reviewed and stable  Post vital signs: Reviewed and stable  Last Vitals:  Vitals Value Taken Time  BP 109/60 02/01/24 08:24  Temp 36.4 C 02/01/24 08:24  Pulse 109 02/01/24 08:24  Resp 18 02/01/24 08:24  SpO2 98 % 02/01/24 08:24    Last Pain:  Vitals:   02/01/24 0824  TempSrc: Oral  PainSc: 0-No pain      Patients Stated Pain Goal: 5 (02/01/24 0651)  Complications: No notable events documented.

## 2024-02-01 NOTE — Discharge Instructions (Signed)
 You are being discharged to home.  Resume your previous diet.  We are waiting for your pathology results.  Your physician has recommended a repeat colonoscopy for surveillance based on pathology results.

## 2024-02-01 NOTE — H&P (Signed)
 Kayla [name] is an 63 y.o. female.   Chief Complaint: screening colonoscopy. HPI: 63 y/o F with PMH anxiety, coming for history of colon polyps. Last colonoscopy in 2018, had one TA.  The patient denies having any complaints such as melena, hematochezia, abdominal pain or distention, change in her bowel movement consistency or frequency, no changes in weight recently.  No family history of colorectal cancer.   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   Elevated bilirubin     Seasonal allergies 07/02/2020   Allergy  shots      Past Surgical History:  Procedure Laterality Date   BREAST BIOPSY Left    BREAST EXCISIONAL BIOPSY Left 2004   benign   BREAST EXCISIONAL BIOPSY Left 1987   benign   COLONOSCOPY N/A 11/10/2016   Procedure: COLONOSCOPY;  Surgeon: Golda Claudis PENNER, MD;  Location: [facility];  Service: Endoscopy;  Laterality: N/A;  830   GANGLION CYST EXCISION Left    POLYPECTOMY  11/10/2016   Procedure: POLYPECTOMY;  Surgeon: Golda Claudis PENNER, MD;  Location: [facility];  Service: Endoscopy;;  colon    Family History  Problem Relation Age of Onset   Healthy Mother    Allergic rhinitis Father    Allergic rhinitis Sister    Breast cancer Paternal Aunt        13s   Angioedema Neg Hx    Asthma Neg Hx    Atopy Neg Hx    Eczema Neg Hx    Urticaria Neg Hx    Immunodeficiency Neg Hx    Social History:  reports that she has never smoked. She has never used smokeless tobacco. She reports current alcohol use. She reports that she does not use drugs.  Allergies: Allergies[1]  Medications Prior to Admission  Medication Sig Dispense Refill   Carboxymeth-Glyc-Polysorb PF (REFRESH OPTIVE MEGA-3) 0.5-1-0.5 % SOLN Apply to eye.     fexofenadine  (ALLEGRA ) 180 MG tablet Take 1 tablet (180 mg total) by mouth daily as needed. 30 tablet 11   RESTASIS 0.05 % ophthalmic emulsion 1 drop 2 (two) times daily.     EPINEPHrine  0.3 mg/0.3 mL IJ  SOAJ injection Inject 0.3 mg into the muscle as needed for anaphylaxis. 2 each 1    No results found for this or any previous visit (from the past 48 hours). No results found.  Review of Systems  All other systems reviewed and are negative.   Blood pressure (!) 145/82, pulse 98, temperature 98.3 F (36.8 C), temperature source Oral, resp. rate 10, height 5' 6 (1.676 m), weight 56.7 kg, last menstrual period 02/11/2012, SpO2 100%. Physical Exam  GENERAL: The patient is AO x3, in no acute distress. HEENT: Head is normocephalic and atraumatic. EOMI are intact. Mouth is well hydrated and without lesions. NECK: Supple. No masses LUNGS: Clear to auscultation. No presence of rhonchi/wheezing/rales. Adequate chest expansion HEART: RRR, normal s1 and s2. ABDOMEN: Soft, nontender, no guarding, no peritoneal signs, and nondistended. BS +. No masses. EXTREMITIES: Without any cyanosis, clubbing, rash, lesions or edema. NEUROLOGIC: AOx3, no focal motor deficit. SKIN: no jaundice, no rashes  Assessment/Plan 63 y/o F with PMH anxiety, coming for history of colon polyps. The patient is at average risk for colorectal cancer.  We will proceed with colonoscopy today.   Toribio Kayla Flavors, MD 02/01/2024, 7:55 AM       [1]  Allergies Allergen Reactions   Sulfonamide Derivatives  Sulfa Antibiotics Rash and Dermatitis

## 2024-02-01 NOTE — Anesthesia Preprocedure Evaluation (Signed)
 Anesthesia Evaluation  Patient identified by MRN, date of birth, ID band Patient awake    Reviewed: Allergy & Precautions, H&P , NPO status , Patient's Chart, lab work & pertinent test results, reviewed documented beta blocker date and time   Airway Mallampati: II  TM Distance: >3 FB Neck ROM: full    Dental no notable dental hx.    Pulmonary neg pulmonary ROS   Pulmonary exam normal breath sounds clear to auscultation       Cardiovascular Exercise Tolerance: Good hypertension, negative cardio ROS  Rhythm:regular Rate:Normal     Neuro/Psych   Anxiety     negative neurological ROS  negative psych ROS   GI/Hepatic Neg liver ROS,GERD  ,,  Endo/Other  negative endocrine ROS    Renal/GU negative Renal ROS  negative genitourinary   Musculoskeletal   Abdominal   Peds  Hematology negative hematology ROS (+)   Anesthesia Other Findings   Reproductive/Obstetrics negative OB ROS                              Anesthesia Physical Anesthesia Plan  ASA: 2  Anesthesia Plan: MAC   Post-op Pain Management:    Induction:   PONV Risk Score and Plan: Propofol  infusion  Airway Management Planned:   Additional Equipment:   Intra-op Plan:   Post-operative Plan:   Informed Consent: I have reviewed the patients History and Physical, chart, labs and discussed the procedure including the risks, benefits and alternatives for the proposed anesthesia with the patient or authorized representative who has indicated his/her understanding and acceptance.     Dental Advisory Given  Plan Discussed with: CRNA  Anesthesia Plan Comments:         Anesthesia Quick Evaluation

## 2024-02-02 ENCOUNTER — Encounter (HOSPITAL_COMMUNITY): Payer: Self-pay | Admitting: Gastroenterology

## 2024-02-02 ENCOUNTER — Ambulatory Visit (INDEPENDENT_AMBULATORY_CARE_PROVIDER_SITE_OTHER): Payer: Self-pay | Admitting: Gastroenterology

## 2024-02-02 ENCOUNTER — Encounter (INDEPENDENT_AMBULATORY_CARE_PROVIDER_SITE_OTHER): Payer: Self-pay | Admitting: *Deleted

## 2024-02-02 LAB — SURGICAL PATHOLOGY

## 2024-02-03 ENCOUNTER — Ambulatory Visit (INDEPENDENT_AMBULATORY_CARE_PROVIDER_SITE_OTHER)

## 2024-02-03 DIAGNOSIS — J309 Allergic rhinitis, unspecified: Secondary | ICD-10-CM

## 2024-02-03 MED ORDER — EPINEPHRINE 0.3 MG/0.3ML IJ SOAJ: 0.3000 mg | INTRAMUSCULAR | 1 refills | Status: AC | PRN

## 2024-02-03 NOTE — Anesthesia Postprocedure Evaluation (Signed)
"   Anesthesia Post Note  Patient: Kayla [name]  Procedure(s) Performed: COLONOSCOPY POLYPECTOMY, INTESTINE  Patient location during evaluation: Phase II Anesthesia Type: MAC Level of consciousness: awake Pain management: pain level controlled Vital Signs Assessment: post-procedure vital signs reviewed and stable Respiratory status: spontaneous breathing and respiratory function stable Cardiovascular status: blood pressure returned to baseline and stable Postop Assessment: no headache and no apparent nausea or vomiting Anesthetic complications: no Comments: Late entry   No notable events documented.   Last Vitals:  Vitals:   02/01/24 0651 02/01/24 0824  BP: (!) 145/82 109/60  Pulse: 98 (!) 109  Resp: 10 18  Temp: 36.8 C 36.4 C  SpO2: 100% 98%    Last Pain:  Vitals:   02/01/24 0824  TempSrc: Oral  PainSc: 0-No pain                 Yvonna PARAS Analiya Porco      "

## 2024-02-06 NOTE — Progress Notes (Signed)
 5 yr TCS noted in recall Patient result letter mailed procedure note and pathology result faxed to PCP

## 2024-02-15 ENCOUNTER — Ambulatory Visit

## 2024-02-15 DIAGNOSIS — J309 Allergic rhinitis, unspecified: Secondary | ICD-10-CM

## 2024-02-24 ENCOUNTER — Ambulatory Visit

## 2024-02-24 DIAGNOSIS — J302 Other seasonal allergic rhinitis: Secondary | ICD-10-CM | POA: Diagnosis not present

## 2024-03-07 ENCOUNTER — Ambulatory Visit

## 2024-03-07 DIAGNOSIS — J302 Other seasonal allergic rhinitis: Secondary | ICD-10-CM

## 2024-03-21 ENCOUNTER — Ambulatory Visit

## 2024-03-21 DIAGNOSIS — J302 Other seasonal allergic rhinitis: Secondary | ICD-10-CM | POA: Diagnosis not present

## 2024-08-01 ENCOUNTER — Ambulatory Visit: Admitting: Allergy & Immunology
